# Patient Record
Sex: Male | Born: 1980 | Race: Black or African American | Hispanic: No | Marital: Married | Smoking: Never smoker
Health system: Southern US, Community
[De-identification: ages and names within clinical notes are randomized; demographics above are authoritative.]

## PROBLEM LIST (undated history)

## (undated) DIAGNOSIS — F4310 Post-traumatic stress disorder, unspecified: Secondary | ICD-10-CM

## (undated) DIAGNOSIS — I10 Essential (primary) hypertension: Secondary | ICD-10-CM

## (undated) DIAGNOSIS — G43909 Migraine, unspecified, not intractable, without status migrainosus: Secondary | ICD-10-CM

## (undated) HISTORY — DX: Essential (primary) hypertension: I10

## (undated) HISTORY — DX: Post-traumatic stress disorder, unspecified: F43.10

## (undated) HISTORY — DX: Migraine, unspecified, not intractable, without status migrainosus: G43.909

---

## 2005-05-03 ENCOUNTER — Emergency Department (HOSPITAL_COMMUNITY): Admission: EM | Admit: 2005-05-03 | Discharge: 2005-05-03 | Payer: Self-pay | Admitting: Emergency Medicine

## 2016-07-07 ENCOUNTER — Emergency Department (HOSPITAL_COMMUNITY): Payer: Self-pay

## 2016-07-07 ENCOUNTER — Emergency Department (HOSPITAL_COMMUNITY)
Admission: EM | Admit: 2016-07-07 | Discharge: 2016-07-07 | Disposition: A | Discharge status: Home / Self Care | Payer: Self-pay | Attending: Emergency Medicine | Admitting: Emergency Medicine

## 2016-07-07 DIAGNOSIS — W268XXA Contact with other sharp object(s), not elsewhere classified, initial encounter: Secondary | ICD-10-CM | POA: Insufficient documentation

## 2016-07-07 DIAGNOSIS — Y939 Activity, unspecified: Secondary | ICD-10-CM | POA: Insufficient documentation

## 2016-07-07 DIAGNOSIS — Y929 Unspecified place or not applicable: Secondary | ICD-10-CM | POA: Insufficient documentation

## 2016-07-07 DIAGNOSIS — Y999 Unspecified external cause status: Secondary | ICD-10-CM | POA: Insufficient documentation

## 2016-07-07 DIAGNOSIS — S61210A Laceration without foreign body of right index finger without damage to nail, initial encounter: Secondary | ICD-10-CM | POA: Insufficient documentation

## 2016-07-07 DIAGNOSIS — S61219A Laceration without foreign body of unspecified finger without damage to nail, initial encounter: Secondary | ICD-10-CM

## 2016-07-07 MED ORDER — LIDOCAINE HCL (PF) 1 % IJ SOLN
5.0000 mL | Freq: Once | INTRAMUSCULAR | Status: AC
  Administered 2016-07-07: 5 mL
  Filled 2016-07-07: qty 5

## 2016-07-07 NOTE — Discharge Instructions (Signed)
Read the information below.  You may return to the Emergency Department at any time for worsening condition or any new symptoms that concern you.  If you develop redness, swelling, pus draining from the wound, or fevers greater than 100.4, return to the ER immediately for a recheck.   °

## 2016-07-07 NOTE — ED Provider Notes (Signed)
[facility]-EMERGENCY DEPT Provider Note   CSN: [id number] Arrival date & time: 07/07/16  2876  First Provider Contact:  First MD Initiated Contact with Patient 07/07/16 [phone]         History   Chief Complaint No chief complaint on file.   HPI Julian Peters is a 35 y.o. male.  The history is provided by the patient.     Right handed patient presents with laceration over 2nd MCP that occurred last night while he was washing dishes.  Reports he reached into a glass to clean it and the glass broke, causing a laceration.  The wound "closed quickly" but continues to be sore, 6/10, worse with palpation, has decreased flexion at that joint due to swelling and pain.  Denies any other injury.  Denies numbness.  Last tetanus vaccination was 2015.   No past medical history on file.  There are no active problems to display for this patient.   No past surgical history on file.     Home Medications    Prior to Admission medications   Not on File    Family History No family history on file.  Social History Social History  Substance Use Topics  . Smoking status: Not on file  . Smokeless tobacco: Not on file  . Alcohol use Not on file     Allergies   Review of patient's allergies indicates no known allergies.   Review of Systems Review of Systems  Constitutional: Negative for chills and fever.  Musculoskeletal: Positive for arthralgias.  Skin: Positive for wound. Negative for color change and pallor.  Allergic/Immunologic: Negative for immunocompromised state.  Neurological: Negative for weakness and numbness.  Hematological: Does not bruise/bleed easily.  Psychiatric/Behavioral: Positive for self-injury (accidental ).     Physical Exam Updated Vital Signs There were no vitals taken for this visit.  Physical Exam  Constitutional: He appears well-developed and well-nourished.  HENT:  Head: Normocephalic and atraumatic.  Neck: Neck supple.  Pulmonary/Chest: Effort  normal.  Musculoskeletal:  Right hand with laceration overlying 2nd MCP, hemostatic.  Mild edema.  Slight decreased ROM of 2nd finger secondary to pain or swelling.  Distal sensation intact.  Moves all joints.  Capillary refill < 2 seconds.    Neurological: He is alert.  Nursing note and vitals reviewed.    ED Treatments / Results  Labs (all labs ordered are listed, but only abnormal results are displayed) Labs Reviewed - No data to display  EKG  EKG Interpretation None       Radiology No results found.  Procedures Procedures (including critical care time)  Medications Ordered in ED Medications  lidocaine (PF) (XYLOCAINE) 1 % injection 5 mL (not administered)     Initial Impression / Assessment and Plan / ED Course  I have reviewed the triage vital signs and the nursing notes.  Pertinent labs & imaging results that were available during my care of the patient were reviewed by me and considered in my medical decision making (see chart for details).  Clinical Course    LACERATION REPAIR Performed by: Trixie Dredge Authorized by: Trixie Dredge Consent: Verbal consent obtained. Risks and benefits: risks, benefits and alternatives were discussed Consent given by: patient Patient identity confirmed: provided demographic data Prepped and Draped in normal sterile fashion Wound explored  Laceration Location: dorsal 2nd MCP  Laceration Length: 1.5cm  No Foreign Bodies seen or palpated  Anesthesia: local infiltration  Local anesthetic: lidocaine 1% without epinephrine  Anesthetic total: 3 ml  Irrigation  method: syringe Amount of cleaning: standard  Skin closure: 5-0 vicryl  Number of sutures: 2  Technique: simple interrupted   Patient tolerance: Patient tolerated the procedure well with no immediate complications.   Afebrile, nontoxic patient with accidental laceration to dorsal right hand.  Wound thoroughly cleaned.  Xray negative.  Neurovascularly intact.   Absorbable sutures placed.  Discussed wound care, return precautions.   D/C home.  Discussed result, findings, treatment, and follow up  with patient.  Pt given return precautions.  Pt verbalizes understanding and agrees with plan.       Final Clinical Impressions(s) / ED Diagnoses   Final diagnoses:  Finger laceration, initial encounter    New Prescriptions There are no discharge medications for this patient.    Trixie Dredge, PA-C 07/07/16 [address] PA-C 07/07/16 1042    Maia Plan, MD 07/07/16 [phone]

## 2017-08-25 IMAGING — CR DG HAND COMPLETE 3+V*R*
3 series · 3 of 3 positions shown · non-contrast
Comparison: None.

CLINICAL DATA: 34-year-old male with laceration overlying the
second MCP joint

EXAM:
RIGHT HAND - COMPLETE 3+ VIEW

[hand pa]
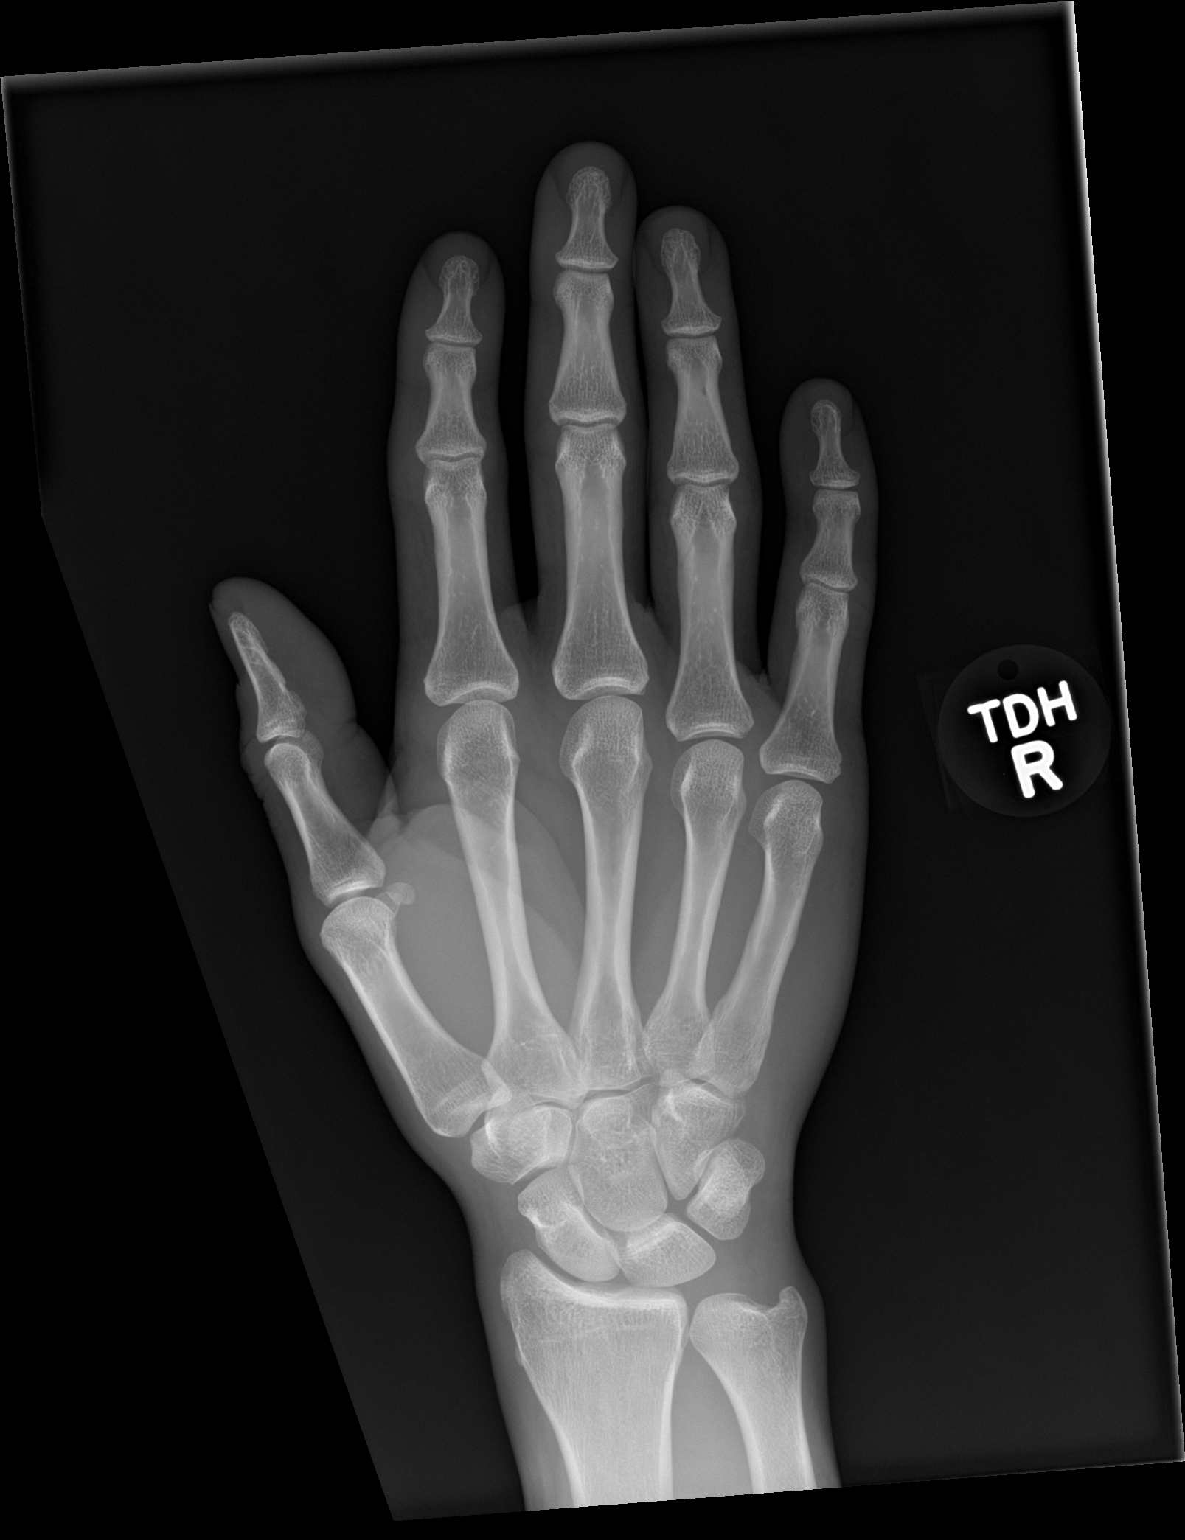

[hand obl]
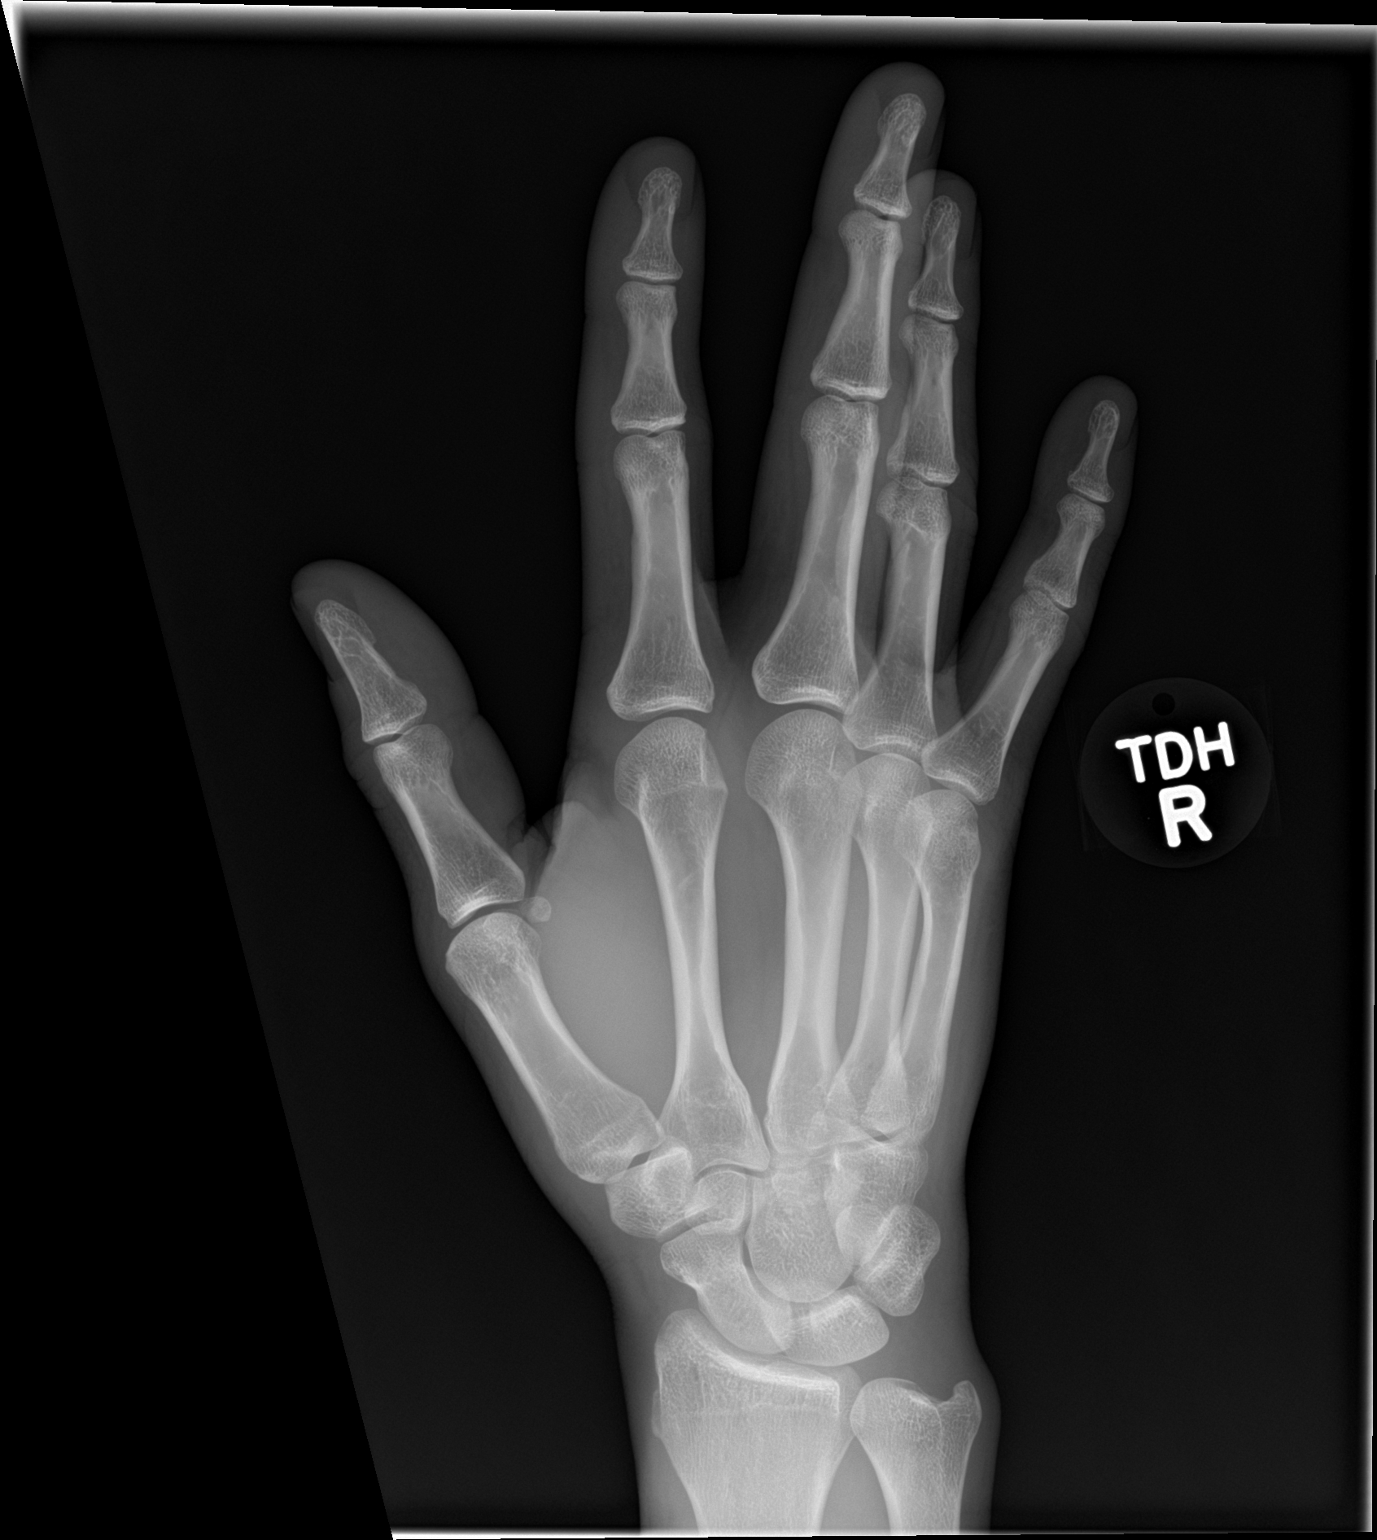

[hand lat]
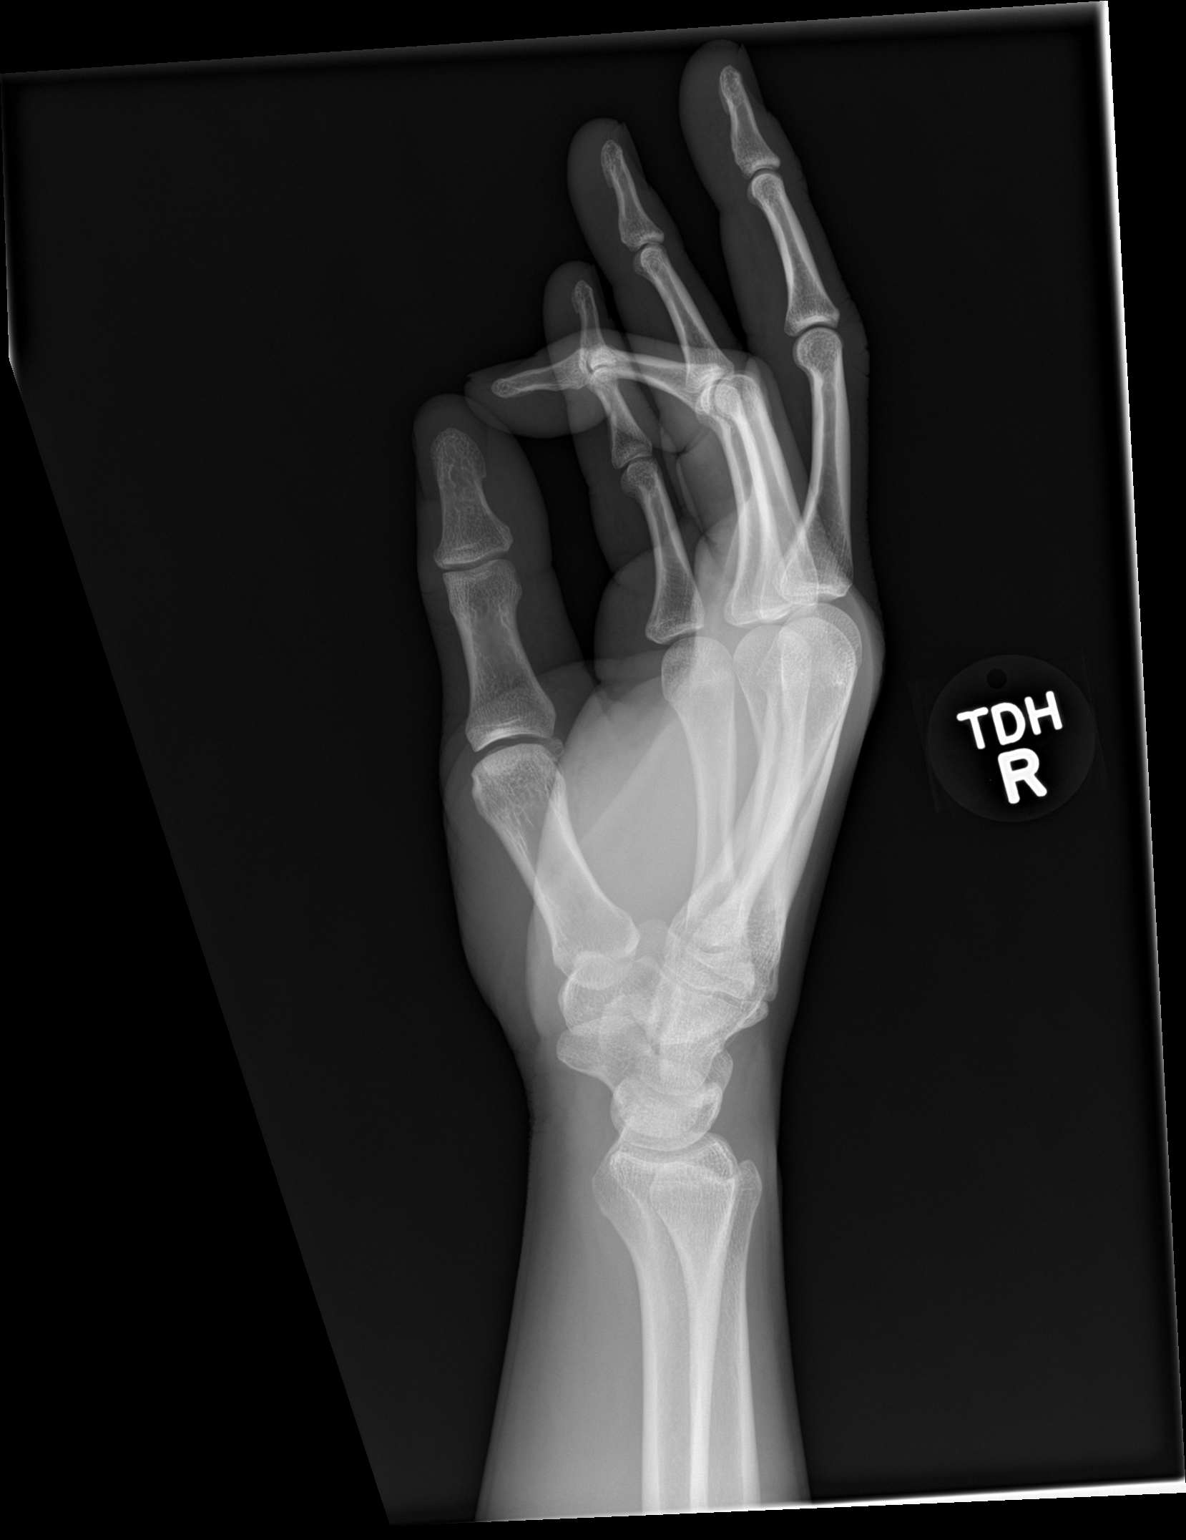

[3 of 3 positions shown; findings below may reference images not displayed]

FINDINGS: There is no evidence of fracture or dislocation. There is no
evidence of arthropathy or other focal bone abnormality. Soft
tissues are unremarkable.
IMPRESSION: Negative.

## 2020-10-27 ENCOUNTER — Ambulatory Visit: Admission: EM | Admit: 2020-10-27 | Discharge: 2020-10-27 | Discharge status: Against Medical Advice

## 2020-11-29 ENCOUNTER — Other Ambulatory Visit: Payer: Self-pay | Admitting: Family Medicine

## 2020-11-29 ENCOUNTER — Other Ambulatory Visit: Payer: Self-pay

## 2020-11-29 ENCOUNTER — Encounter: Payer: Self-pay | Admitting: Family Medicine

## 2020-11-29 ENCOUNTER — Telehealth: Payer: Self-pay

## 2020-11-29 ENCOUNTER — Ambulatory Visit (INDEPENDENT_AMBULATORY_CARE_PROVIDER_SITE_OTHER): Admitting: Family Medicine

## 2020-11-29 VITALS — BP 152/90 | HR 72 | Temp 97.5°F | Ht 67.5 in | Wt 168.5 lb

## 2020-11-29 DIAGNOSIS — D573 Sickle-cell trait: Secondary | ICD-10-CM

## 2020-11-29 DIAGNOSIS — G8929 Other chronic pain: Secondary | ICD-10-CM

## 2020-11-29 DIAGNOSIS — I10 Essential (primary) hypertension: Secondary | ICD-10-CM

## 2020-11-29 DIAGNOSIS — F4310 Post-traumatic stress disorder, unspecified: Secondary | ICD-10-CM

## 2020-11-29 DIAGNOSIS — R0789 Other chest pain: Secondary | ICD-10-CM

## 2020-11-29 DIAGNOSIS — M79671 Pain in right foot: Secondary | ICD-10-CM

## 2020-11-29 DIAGNOSIS — M545 Low back pain, unspecified: Secondary | ICD-10-CM | POA: Diagnosis not present

## 2020-11-29 DIAGNOSIS — M79672 Pain in left foot: Secondary | ICD-10-CM

## 2020-11-29 LAB — CBC
HCT: 45.3 % (ref 39.0–52.0)
Hemoglobin: 15.3 g/dL (ref 13.0–17.0)
MCHC: 33.8 g/dL (ref 30.0–36.0)
MCV: 84 fl (ref 78.0–100.0)
Platelets: 240 10*3/uL (ref 150.0–400.0)
RBC: 5.39 Mil/uL (ref 4.22–5.81)
RDW: 13.5 % (ref 11.5–15.5)
WBC: 4.6 10*3/uL (ref 4.0–10.5)

## 2020-11-29 LAB — COMPREHENSIVE METABOLIC PANEL
ALT: 14 U/L (ref 0–53)
AST: 20 U/L (ref 0–37)
Albumin: 4.6 g/dL (ref 3.5–5.2)
Alkaline Phosphatase: 48 U/L (ref 39–117)
BUN: 11 mg/dL (ref 6–23)
CO2: 33 mEq/L — ABNORMAL HIGH (ref 19–32)
Calcium: 9.7 mg/dL (ref 8.4–10.5)
Chloride: 98 mEq/L (ref 96–112)
Creatinine, Ser: 1.15 mg/dL (ref 0.40–1.50)
GFR: 80.26 mL/min (ref >60.00)
Glucose, Bld: 90 mg/dL (ref 70–99)
Potassium: 3.6 mEq/L (ref 3.5–5.1)
Sodium: 137 mEq/L (ref 135–145)
Total Bilirubin: 1.2 mg/dL (ref 0.2–1.2)
Total Protein: 8 g/dL (ref 6.0–8.3)

## 2020-11-29 MED ORDER — LISINOPRIL 10 MG PO TABS: 10.0000 mg | ORAL_TABLET | Freq: Every day | ORAL | 3 refills | Status: DC

## 2020-11-29 NOTE — Patient Instructions (Addendum)
Chest pain - EKG looked good - referral to cardiology  Foot pain - schedule visit with Dr. Patsy Lager  Back pain - physical therapy referral   Blood pressure - start lisinopril 10  - continue hydrochlorothiazide and amlodipine  - return in 3 months for blood pressure check  Try Melatonin 3-5 mg, take 1-2 hours before bed  Sleep hygiene checklist: 1. Avoid naps during the day 2. Avoid stimulants such as caffeine and nicotine. Avoid bedtime alcohol (it can speed onset of sleep but the body's metabolism can cause awakenings). At least 2 hours before bedtime 3. All forms of exercise help ensure sound sleep - limit vigorous exercise to morning or late afternoon 4. Avoid food too close to bedtime including chocolate (which contains caffeine) 5. Soak up natural light 6. Establish regular bedtime routine. 7. Associate bed with sleep - avoid TV, computer or phone, reading while in bed. 8. Ensure pleasant, relaxing sleep environment - quiet, dark, cool room.  Good Sleep Hygiene Habits -- Got to bed and wake up within an hour of the same time every day -- Avoid bright screens (from laptop, phone, TV) within at least 30 minutes before bed. The "blue light" supresses the sleep hormone melatonin and the content may stimulate as well -- Maintain a quiet and dark sleep environment (blackout curtains, turn on a fan or white noise to block out disruptive sounds) -- Practicing relaxing activites before bed (taking a shower, reading a book, journaling, meditation app) -- To quiet a busy mind -- consider journaling before bed (jotting down reminders, worry thoughts, as well as positive things like a gratitude list)   Begin a Mindfulness/Meditation practice -- this can take a little as 3 minutes -- You can find resources in books -- Or you can download apps like  ---- Headspace App (which currently has free content called "Weathering the Storm") ---- Calm (which has a few free options)  ---- Insignt  Timer ---- Stop, Breathe & Think  # With each of these Apps - you should decline the "start free trial" offer and as you search through the App should be able to access some of their free content. You can also chose to pay for the content if you find one that works well for you.   # Many of them also offer sleep specific content which may help with insomnia

## 2020-11-29 NOTE — Progress Notes (Signed)
Subjective:     Julian Peters is a 39 y.o. 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 - endorses occasion cp with heavy lifting or running - will get better with rest or stretching - no sob -   #PTSD - takes a long time to fall asleep - tried trazodone - no caffeine  Review of Systems   Social History   Tobacco Use  Smoking Status Never Smoker  Smokeless Tobacco Never Used        Objective:    BP Readings from Last 3 Encounters:  11/29/20 (!) 152/90  07/07/16 157/91   Wt Readings from Last 3 Encounters:  11/29/20 168 lb 8 oz (76.4 kg)    BP (!) 152/90   Pulse 72   Temp (!) 97.5 F (36.4 C) (Temporal)   Ht 5' 7.5" (1.715 m)   Wt 168 lb 8 oz (76.4 kg)   SpO2 97%   BMI 26.00 kg/m    Physical Exam Constitutional:      Appearance: Normal appearance. He is not ill-appearing or diaphoretic.  HENT:     Right Ear: External ear normal.     Left Ear: External ear normal.     Nose: Nose normal.  Eyes:     General: No scleral icterus.    Extraocular Movements: Extraocular movements intact.     Conjunctiva/sclera: Conjunctivae normal.  Cardiovascular:     Rate and Rhythm: Normal rate and regular rhythm.     Heart sounds: No murmur heard.   Pulmonary:     Effort: Pulmonary effort is normal. No respiratory distress.     Breath sounds: Normal breath sounds. No wheezing.  Musculoskeletal:     Cervical back: Neck supple.  Skin:    General: Skin is warm and dry.  Neurological:     Mental Status: He is alert. Mental status is at baseline.  Psychiatric:        Mood and Affect: Mood normal.      EKG: Sinus Bradycardia, no ST changes, no T-wave abnormalities     Assessment & Plan:   Problem List Items Addressed This Visit      Cardiovascular and Mediastinum   Essential hypertension    BP elevated. Pt endorses  adherence to HCTZ 25 mg and amlodipine 10 mg. Initial bp on diagnosis with SBP 180 so he will likely benefit from 3rd agent. Start lisinopril 10 mg. Labs today and 2-3 weeks to check kidney function. Return for BP check in 3 months. Encouraged returning to regular exercise with PT referral. Cardiology given CP with activity.       Relevant Medications   hydrochlorothiazide (HYDRODIURIL) 25 MG tablet   amLODipine (NORVASC) 10 MG tablet   lisinopril (ZESTRIL) 10 MG tablet   Other Relevant Orders   Comprehensive metabolic panel   Lipid panel   CBC     Other   PTSD (post-traumatic stress disorder)    Advised trial of melatonin. Could consider prazosin however, denies consistent nightmares. Follow-up prn.       Chronic low back pain    Encouraged trial of PT as pain is limiting his exercise habits. Referral placed.       Relevant Orders   Ambulatory referral to Physical Therapy   Sickle cell trait (HCC)   Relevant Orders   CBC   Other chest  pain - Primary    EKG reassuring though pt with HTN and CP with high intensity activity which resolves with rest. Given that he serves in the [employer] lower suspicion and denies family history but will refer to cardiology to consider additional work-up.       Relevant Orders   Ambulatory referral to Cardiology   EKG 12-Lead (Completed)   Pain in both feet    Unable to do full exam today. ?plantar fascitis vs other arch related pain. Advised seeing Dr. Patsy Lager for evaluation and treatment.           Return in about 3 months (around 02/27/2021) for blood pressure.  Lynnda Child, MD  This visit occurred during the SARS-CoV-2 public health emergency.  Safety protocols were in place, including screening questions prior to the visit, additional usage of staff PPE, and extensive cleaning of exam room while observing appropriate contact time as indicated for disinfecting solutions.

## 2020-11-29 NOTE — Assessment & Plan Note (Signed)
EKG reassuring though pt with HTN and CP with high intensity activity which resolves with rest. Given that he serves in the [employer] lower suspicion and denies family history but will refer to cardiology to consider additional work-up.

## 2020-11-29 NOTE — Telephone Encounter (Signed)
[facility] Night - Client Nonclinical Telephone Record AccessNurse Client [facility] Night - Client Client Site [facility] - Night Contact Type Call Who Is Calling Patient / Member / Family / Caregiver Caller Name Duanne Duchesne Caller Phone Number [phone] Patient Name Julian Peters Patient DOB 1981/06/14 Call Type Message Only Information Provided Reason for Call Request for General Office Information Initial Comment Pt is needing to know the time of their appt for tomorrow. Disp. Time Disposition Final User 11/28/2020 5:41:05 PM General Information Provided Yes Wisdom, Melynda Call Closed By: Rodena Piety Transaction Date/Time: 11/28/2020 5:39:34 PM (ET)

## 2020-11-29 NOTE — Assessment & Plan Note (Signed)
Unable to do full exam today. ?plantar fascitis vs other arch related pain. Advised seeing Dr. Patsy Lager for evaluation and treatment.

## 2020-11-29 NOTE — Assessment & Plan Note (Signed)
Encouraged trial of PT as pain is limiting his exercise habits. Referral placed.

## 2020-11-29 NOTE — Assessment & Plan Note (Signed)
Advised trial of melatonin. Could consider prazosin however, denies consistent nightmares. Follow-up prn.

## 2020-11-29 NOTE — Telephone Encounter (Signed)
I spoke with pt and pt has already called [facility] this morning and is aware pt has appt today at 9:20. Nothing further needed.

## 2020-11-29 NOTE — Assessment & Plan Note (Signed)
BP elevated. Pt endorses adherence to HCTZ 25 mg and amlodipine 10 mg. Initial bp on diagnosis with SBP 180 so he will likely benefit from 3rd agent. Start lisinopril 10 mg. Labs today and 2-3 weeks to check kidney function. Return for BP check in 3 months. Encouraged returning to regular exercise with PT referral. Cardiology given CP with activity.

## 2020-12-04 ENCOUNTER — Telehealth: Payer: Self-pay | Admitting: Family Medicine

## 2020-12-04 NOTE — Telephone Encounter (Signed)
Dr Selena Batten let me know if this is medically necessary before I call and try to get this approved. And any other information you want me to know about. Thank you

## 2020-12-04 NOTE — Telephone Encounter (Signed)
Hayley called in and stated that Julian Peters is active [employer] and the referral /authorization would have to go through [employer] stating that is medically necessary

## 2020-12-04 NOTE — Telephone Encounter (Signed)
Submitted pre approval to get access to the website on behalf of the provider to submit authorization for this. Awaiting response before proceeding.

## 2020-12-04 NOTE — Telephone Encounter (Signed)
Pt with CP with physical activity and has a hx of HTN which is not well controlled. Feel he would benefit from further cardiology work-up.   Also with long standing chronic back pain and would benefit from physical therapy evaluation and treatment to help with improvement in symptoms and return to routine activity.   Both are medically necessary

## 2020-12-12 NOTE — Telephone Encounter (Signed)
Could not get through to the right department on the phone, sent follow up email in regards to this. Awaiting reply

## 2020-12-13 ENCOUNTER — Encounter: Payer: Self-pay | Admitting: Family Medicine

## 2020-12-13 NOTE — Telephone Encounter (Signed)
Working on this authorization still. Sent email to [employer] due to the issues with website submission of referral. Hayley at [employer] is aware of this and I will update her when I have more information

## 2020-12-13 NOTE — Telephone Encounter (Signed)
Received response from [facility] on this patient. I advised Hayley with [employer] of this and send them this information. For our records this is what the response was (this is in the provider portal also)  "12/13/2020 Ticket #[id number]   From:Wayneesha S. Sent:12/13/2020 Category:Referral/Authorizations  Thank you for using [employer] Secure Messaging for [employer].   You may view the patient's eligibility and TRICARE plan using the 'TRICARE PATIENT PROFILE' tab. 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   The patient does not require a referral authorization or prior authorization from TRICARE; for the patient to see a specialist or for outpatient setting for physical therapy.    If this patient would like to become TRICARE Prime, then they must speak to a billing and enrollment specialist at [phone]. Billing and enrollment is the only one able to enroll the patient into TRICARE Prime.    I hope this information has been helpful. If you have any additional questions or if you need further assistance, please contact [employer] at [phone], chat, or through secure messaging.  Thank you for allowing me to serve you today.  Gregor Hams S.  [facility]

## 2020-12-14 ENCOUNTER — Other Ambulatory Visit: Payer: Self-pay

## 2020-12-14 ENCOUNTER — Encounter: Payer: Self-pay | Admitting: Internal Medicine

## 2020-12-14 ENCOUNTER — Ambulatory Visit (INDEPENDENT_AMBULATORY_CARE_PROVIDER_SITE_OTHER): Admitting: Internal Medicine

## 2020-12-14 VITALS — BP 128/80 | HR 62 | Ht 68.0 in | Wt 165.0 lb

## 2020-12-14 DIAGNOSIS — R079 Chest pain, unspecified: Secondary | ICD-10-CM | POA: Diagnosis not present

## 2020-12-14 DIAGNOSIS — I10 Essential (primary) hypertension: Secondary | ICD-10-CM

## 2020-12-14 NOTE — Progress Notes (Signed)
New Outpatient Visit Date: 12/14/2020  Referring Provider: Lynnda Child, MD [address] [place],  [place] [address]  Chief Complaint: Chest pain  HPI:  Julian Peters is a 39 y.o. male who is being seen today for the evaluation of chest pain at the request of Dr. Selena Batten. He has a history of hypertension, sickle cell trait, PTSD, and chronic low back pain.  Julian Peters reports episodic chest pain/tightness that has been present for years.  Sometimes it may happen on a weekly basis, though he can go a few months at a time without it happening.  It is in the center of his chest with a maximum intensity of 7-8/10.  It does not radiate and there are no associated symptoms.  At times, he will stretch his chest wall and feel a pop in the center of his chest with improvement in the pain.  The discomfort is sometimes happens when he exerts himself or carries a heavy object.  He believes that he may have had some cardiac testing in the past through the [employer] though he does not believe he ever underwent ischemia testing.  --------------------------------------------------------------------------------------------------  Cardiovascular History & Procedures: Cardiovascular Problems:  Chest pain  Risk Factors:  Hypertension and male gender  Cath/PCI:  None  CV Surgery:  None  EP Procedures and Devices:  None  Non-Invasive Evaluation(s):  None  Recent CV Pertinent Labs: Lab Results  Component Value Date   K 3.6 11/29/2020   BUN 11 11/29/2020   CREATININE 1.15 11/29/2020    --------------------------------------------------------------------------------------------------  Past Medical History:  Diagnosis Date  . Hypertension   . PTSD (post-traumatic stress disorder)     History reviewed. No pertinent surgical history.  Current Meds  Medication Sig  . amLODipine (NORVASC) 10 MG tablet Take 5 mg by mouth daily.  . hydrochlorothiazide (HYDRODIURIL) 25 MG tablet Take 25 mg  by mouth daily.  Marland Kitchen lisinopril (ZESTRIL) 10 MG tablet Take 1 tablet (10 mg total) by mouth daily.    Allergies: Patient has no known allergies.  Social History   Tobacco Use  . Smoking status: Never Smoker  . Smokeless tobacco: Never Used  Vaping Use  . Vaping Use: Never used  Substance Use Topics  . Alcohol use: Never  . Drug use: Never    Family History  Problem Relation Age of Onset  . Sickle cell anemia Father   . Diabetes Maternal Grandmother   . Heart disease Neg Hx     Review of Systems: A 12-system review of systems was performed and was negative except as noted in the HPI.  --------------------------------------------------------------------------------------------------  Physical Exam: BP 128/80 (BP Location: Right Arm, Patient Position: Sitting, Cuff Size: Normal)   Pulse 62   Ht 5\' 8"  (1.727 m)   Wt 165 lb (74.8 kg)   SpO2 97%   BMI 25.09 kg/m   General: NAD. HEENT: No conjunctival pallor or scleral icterus. Facemask in place. Neck: Supple without lymphadenopathy, thyromegaly, JVD, or HJR. No carotid bruit. Lungs: Normal work of breathing. Clear to auscultation bilaterally without wheezes or crackles. Heart: Regular rate and rhythm without murmurs, rubs, or gallops. Non-displaced PMI. Abd: Bowel sounds present. Soft, NT/ND without hepatosplenomegaly Ext: No lower extremity edema. Radial, PT, and DP pulses are 2+ bilaterally Skin: Warm and dry without rash. Neuro: CNIII-XII intact. Strength and fine-touch sensation intact in upper and lower extremities bilaterally. Psych: Normal mood and affect.  EKG: Normal sinus rhythm without abnormality.  No significant change from prior  tracing on 11/29/2020.  Lab Results  Component Value Date   WBC 4.6 11/29/2020   HGB 15.3 11/29/2020   HCT 45.3 11/29/2020   MCV 84.0 11/29/2020   PLT 240.0 11/29/2020    Lab Results  Component Value Date   NA 137 11/29/2020   K 3.6 11/29/2020   CL 98 11/29/2020   CO2 33  (H) 11/29/2020   BUN 11 11/29/2020   CREATININE 1.15 11/29/2020   GLUCOSE 90 11/29/2020   ALT 14 11/29/2020    No results found for: CHOL, HDL, LDLCALC, LDLDIRECT, TRIG, CHOLHDL   --------------------------------------------------------------------------------------------------  ASSESSMENT AND PLAN: Chest pain: [name] has episodic chest discomfort that has both typical and atypical features, present for years.  Cardiac risk factors include hypertension and male gender.  EKG and physical exam today are unrevealing.  Though my overall suspicion for ischemic heart disease is low, we have agreed to perform an exercise tolerance test.  If this is low risk, I would favor deferring additional testing and consider empiric treatment for musculoskeletal chest pain.  Lipid panel should be considered for risk stratification the next time Julian Peters has fasting labs drawn through his PCP.  Shared Decision Making/Informed Consent{ The risks [chest pain, shortness of breath, cardiac arrhythmias, dizziness, blood pressure fluctuations, myocardial infarction, stroke/transient ischemic attack, and life-threatening complications (estimated to be 1 in 10,000)], benefits (risk stratification, diagnosing coronary artery disease, treatment guidance) and alternatives of an exercise tolerance test were discussed in detail with Julian Peters and he agrees to proceed.  Hypertension: Blood pressure upper normal today.  We will continue his current regimen of amlodipine, HCTZ, and lisinopril, with ongoing management per Dr. Selena Batten.  Follow-up: Return to clinic as needed based on results of stress test and symptoms.  Yvonne Kendall, MD 12/14/2020 2:37 PM

## 2020-12-14 NOTE — Patient Instructions (Signed)
Medication Instructions:   Your physician recommends that you continue on your current medications as directed. Please refer to the Current Medication list given to you today.  *If you need a refill on your cardiac medications before your next appointment, please call your pharmacy*   Lab Work: None ordered    Testing/Procedures:  Your provider has recommended that we order a Treadmill Stress Test:  Date__________________  Time__________________  - you may eat a light breakfast/ lunch prior to your procedure - no caffeine for 24 hours prior to your test (coffee, tea, soft drinks, or chocolate)  - no smoking/ vaping for 4 hours prior to your test - you may take your regular medications the day of your test. - bring any inhalers with you to your test - wear comfortable clothing & tennis/ non-skid shoes to walk on the treadmill  COVID PRE- TEST: You will need a COVID TEST prior to the procedure:  LOCATION: [facility]Thru Testing site.  DATE/TIME:                  (8:00 am- 1 pm)    Follow-Up: At [facility], you and your health needs are our priority.  As part of our continuing mission to provide you with exceptional heart care, we have created designated Provider Care Teams.  These Care Teams include your primary Cardiologist (physician) and Advanced Practice Providers (APPs -  Physician Assistants and Nurse Practitioners) who all work together to provide you with the care you need, when you need it.  We recommend signing up for the patient portal called "MyChart".  Sign up information is provided on this After Visit Summary.  MyChart is used to connect with patients for Virtual Visits (Telemedicine).  Patients are able to view lab/test results, encounter notes, upcoming appointments, etc.  Non-urgent messages can be sent to your provider as well.   To learn more about what you can do with MyChart, go to [email or web address].    Your next  appointment:    Follow up AS NEEDED depending on your chest pain and results of stress test  The format for your next appointment:   In Person  Provider:   You may see Dr. Okey Dupre or one of the following Advanced Practice Providers on your designated Care Team:    Nicolasa Ducking, NP  Eula Listen, PA-C  Marisue Ivan, PA-C  [place], [place]  Gillian Shields, NP

## 2020-12-18 ENCOUNTER — Other Ambulatory Visit
Admission: RE | Admit: 2020-12-18 | Discharge: 2020-12-18 | Disposition: A | Discharge status: Home / Self Care | Source: Ambulatory Visit | Attending: Internal Medicine | Admitting: Internal Medicine

## 2020-12-18 DIAGNOSIS — Z20822 Contact with and (suspected) exposure to covid-19: Secondary | ICD-10-CM | POA: Diagnosis not present

## 2020-12-18 DIAGNOSIS — Z01818 Encounter for other preprocedural examination: Secondary | ICD-10-CM | POA: Insufficient documentation

## 2020-12-19 ENCOUNTER — Ambulatory Visit (INDEPENDENT_AMBULATORY_CARE_PROVIDER_SITE_OTHER)

## 2020-12-19 ENCOUNTER — Other Ambulatory Visit: Payer: Self-pay

## 2020-12-19 DIAGNOSIS — R079 Chest pain, unspecified: Secondary | ICD-10-CM | POA: Diagnosis not present

## 2020-12-19 LAB — EXERCISE TOLERANCE TEST
Estimated workload: 17.2 METS
Exercise duration (min): 14 min
Exercise duration (sec): 12 s
MPHR: 181 {beats}/min
Peak HR: 181 {beats}/min
Percent HR: 100 %
RPE: 17
Rest HR: 72 {beats}/min

## 2020-12-19 LAB — SARS CORONAVIRUS 2 (TAT 6-24 HRS): SARS Coronavirus 2: NEGATIVE

## 2020-12-20 ENCOUNTER — Telehealth: Payer: Self-pay

## 2020-12-20 NOTE — Telephone Encounter (Signed)
Late entry for 01/19/21 ETT.  Patient hypertensive during recovery. His systolic BP returned to his initial reading, his diastolic was 115 after >10 mins of recovery time.  Discussed with our office DOD Dr. Mariah Milling.  Dr. Mariah Milling instructions given to the pt: Pt is off work today. He does have a BP machine at home. He is to go home, relax for a bit and then recheck his BP. If he his BP remains elevated > 140/90. Pt is to contact the office this afternoon to provide and update. Further instructions to be given at that time.  Adv the patient to continue to monitor his BP regularly. He is to contact the office to update Dr. Okey Dupre  if his BP is consistently elevated.  Patient verbalized understanding and is agreeable with the plan.

## 2021-02-27 ENCOUNTER — Ambulatory Visit (INDEPENDENT_AMBULATORY_CARE_PROVIDER_SITE_OTHER): Admitting: Family Medicine

## 2021-02-27 ENCOUNTER — Encounter: Payer: Self-pay | Admitting: Family Medicine

## 2021-02-27 ENCOUNTER — Other Ambulatory Visit: Payer: Self-pay

## 2021-02-27 VITALS — BP 118/80 | HR 65 | Temp 97.8°F | Ht 68.0 in | Wt 165.5 lb

## 2021-02-27 DIAGNOSIS — R0789 Other chest pain: Secondary | ICD-10-CM | POA: Diagnosis not present

## 2021-02-27 DIAGNOSIS — I10 Essential (primary) hypertension: Secondary | ICD-10-CM | POA: Diagnosis not present

## 2021-02-27 LAB — BASIC METABOLIC PANEL
BUN: 13 mg/dL (ref 6–23)
CO2: 32 mEq/L (ref 19–32)
Calcium: 9.4 mg/dL (ref 8.4–10.5)
Chloride: 98 mEq/L (ref 96–112)
Creatinine, Ser: 1.15 mg/dL (ref 0.40–1.50)
GFR: 80.12 mL/min (ref >60.00)
Glucose, Bld: 93 mg/dL (ref 70–99)
Potassium: 4 mEq/L (ref 3.5–5.1)
Sodium: 137 mEq/L (ref 135–145)

## 2021-02-27 NOTE — Assessment & Plan Note (Signed)
Controlled. Labs today. Cont lisinopril 10 mg, HCTZ 25 mg, and amlodpine 10 mg. Discussed could consider combination pill in the future.

## 2021-02-27 NOTE — Patient Instructions (Addendum)
Call when running out or you can ask your pharmacist about how much the combination pill Zestoretic (lisinopril-HCTZ combination)   Bring a copy of immunizations   And your Hep C / HIV results

## 2021-02-27 NOTE — Assessment & Plan Note (Signed)
Normal ETT. Suspect MSK. Advised stretching and strengthening of chest muscles

## 2021-02-27 NOTE — Progress Notes (Signed)
   Subjective:     Julian Peters is a 40 y.o. male presenting for Follow-up (BP )     HPI  #HTN - taking medication - feels his bp is improved - is getting cp when he lifts something heavy - no sob - occasional headaches - no side effects with the medication  #back pain - improving with PT   Review of Systems  11/29/2020: Clinic - HTN - HCTZ, amlodipine, start lisinopril. CP - seeing cards and getting exercise tolerance. ETT: DBP >115 10 minutes into recover  Social History   Tobacco Use  Smoking Status Never Smoker  Smokeless Tobacco Never Used        Objective:    BP Readings from Last 3 Encounters:  02/27/21 118/80  12/14/20 128/80  11/29/20 (!) 152/90   Wt Readings from Last 3 Encounters:  02/27/21 165 lb 8 oz (75.1 kg)  12/14/20 165 lb (74.8 kg)  11/29/20 168 lb 8 oz (76.4 kg)    BP 118/80   Pulse 65   Temp 97.8 F (36.6 C) (Temporal)   Ht 5\' 8"  (1.727 m)   Wt 165 lb 8 oz (75.1 kg)   SpO2 99%   BMI 25.16 kg/m    Physical Exam Constitutional:      Appearance: Normal appearance. He is not ill-appearing or diaphoretic.  HENT:     Right Ear: External ear normal.     Left Ear: External ear normal.  Eyes:     General: No scleral icterus.    Extraocular Movements: Extraocular movements intact.     Conjunctiva/sclera: Conjunctivae normal.  Cardiovascular:     Rate and Rhythm: Normal rate.  Pulmonary:     Effort: Pulmonary effort is normal.  Chest:     Chest wall: No tenderness or crepitus.  Musculoskeletal:     Cervical back: Neck supple.  Skin:    General: Skin is warm and dry.  Neurological:     Mental Status: He is alert. Mental status is at baseline.  Psychiatric:        Mood and Affect: Mood normal.     ETT: normal apart from elevated bp      Assessment & Plan:   Problem List Items Addressed This Visit      Cardiovascular and Mediastinum   Essential hypertension - Primary    Controlled. Labs today. Cont lisinopril 10  mg, HCTZ 25 mg, and amlodpine 10 mg. Discussed could consider combination pill in the future.       Relevant Orders   Basic metabolic panel     Other   Other chest pain    Normal ETT. Suspect MSK. Advised stretching and strengthening of chest muscles          Return in about 1 year (around 02/27/2022).  03/01/2022, MD  This visit occurred during the SARS-CoV-2 public health emergency.  Safety protocols were in place, including screening questions prior to the visit, additional usage of staff PPE, and extensive cleaning of exam room while observing appropriate contact time as indicated for disinfecting solutions.

## 2021-05-03 ENCOUNTER — Telehealth: Payer: Self-pay | Admitting: Family Medicine

## 2021-05-03 DIAGNOSIS — I10 Essential (primary) hypertension: Secondary | ICD-10-CM

## 2021-05-03 MED ORDER — LISINOPRIL 10 MG PO TABS: 10.0000 mg | ORAL_TABLET | Freq: Every day | ORAL | 1 refills | Status: DC

## 2021-05-03 NOTE — Telephone Encounter (Signed)
  LAST APPOINTMENT DATE: 02/27/2021   NEXT APPOINTMENT DATE:@Visit  date not found  MEDICATION: lisinopril  PHARMACY: [facility]- [address]  Let patient know to contact pharmacy at the end of the day to make sure medication is ready.  Please notify patient to allow 48-72 hours to process  Encourage patient to contact the pharmacy for refills or they can request refills through [facility]  CLINICAL FILLS OUT ALL BELOW:   LAST REFILL:  QTY:  REFILL DATE:    OTHER COMMENTS:    Okay for refill?  Please advise

## 2021-05-03 NOTE — Addendum Note (Signed)
Addended by: Erby Pian on: 05/03/2021 11:33 AM   Modules accepted: Orders

## 2021-08-08 ENCOUNTER — Other Ambulatory Visit: Payer: Self-pay

## 2021-08-08 ENCOUNTER — Encounter: Payer: Self-pay | Admitting: Family Medicine

## 2021-08-08 ENCOUNTER — Ambulatory Visit (INDEPENDENT_AMBULATORY_CARE_PROVIDER_SITE_OTHER): Admitting: Family Medicine

## 2021-08-08 VITALS — BP 110/80 | HR 60 | Temp 97.9°F | Ht 68.0 in | Wt 167.2 lb

## 2021-08-08 DIAGNOSIS — M7661 Achilles tendinitis, right leg: Secondary | ICD-10-CM

## 2021-08-08 DIAGNOSIS — M7662 Achilles tendinitis, left leg: Secondary | ICD-10-CM | POA: Diagnosis not present

## 2021-08-08 DIAGNOSIS — Z23 Encounter for immunization: Secondary | ICD-10-CM | POA: Diagnosis not present

## 2021-08-08 NOTE — Progress Notes (Signed)
   Subjective:     Julian Peters is a 40 y.o. male presenting for Ankle Pain (Both ankles x months )     Ankle Pain  Associated symptoms include numbness.   #ankle pain - both sides - in the back - near achilles tendon - after periods of rest/driving and then pain with standing - pain with walking for 10-15 minutes - will also get pain after a long run - initially just the right and sometimes the left - R>L - hx of achilles tendon tear in 2005 - playing basketball on the left side - has been doing stretches and general exercises    Review of Systems  Constitutional:  Negative for chills and fever.  Neurological:  Positive for numbness. Negative for weakness.    Social History   Tobacco Use  Smoking Status Never  Smokeless Tobacco Never        Objective:    BP Readings from Last 3 Encounters:  08/08/21 110/80  02/27/21 118/80  12/14/20 128/80   Wt Readings from Last 3 Encounters:  08/08/21 167 lb 4 oz (75.9 kg)  02/27/21 165 lb 8 oz (75.1 kg)  12/14/20 165 lb (74.8 kg)    BP 110/80   Pulse 60   Temp 97.9 F (36.6 C) (Temporal)   Ht 5\' 8"  (1.727 m)   Wt 167 lb 4 oz (75.9 kg)   SpO2 97%   BMI 25.43 kg/m    Physical Exam Constitutional:      Appearance: Normal appearance. He is not ill-appearing or diaphoretic.  HENT:     Right Ear: External ear normal.     Left Ear: External ear normal.  Eyes:     General: No scleral icterus.    Extraocular Movements: Extraocular movements intact.     Conjunctiva/sclera: Conjunctivae normal.  Cardiovascular:     Rate and Rhythm: Normal rate.  Pulmonary:     Effort: Pulmonary effort is normal.  Musculoskeletal:     Cervical back: Neck supple.     Comments: Foot Inspection: no swelling erythema, b/l ROM: normal b/l Strength: normal b/l Palpation: Right ttp along the achilles tendon, left ttp along the arch of the foot, otherwise no bony ttp No laxity   Skin:    General: Skin is warm and dry.   Neurological:     Mental Status: He is alert. Mental status is at baseline.  Psychiatric:        Mood and Affect: Mood normal.          Assessment & Plan:   Problem List Items Addressed This Visit       Musculoskeletal and Integument   Achilles tendinitis of both lower extremities - Primary    Advised NSAIDs and home exercise/stretching routine. PT if not improving.       Other Visit Diagnoses     Need for Tdap vaccination       Relevant Orders   Tdap vaccine greater than or equal to 7yo IM (Completed)        Return if symptoms worsen or fail to improve.  , MD  This visit occurred during the SARS-CoV-2 public health emergency.  Safety protocols were in place, including screening questions prior to the visit, additional usage of staff PPE, and extensive cleaning of exam room while observing appropriate contact time as indicated for disinfecting solutions.

## 2021-08-08 NOTE — Assessment & Plan Note (Signed)
Advised NSAIDs and home exercise/stretching routine. PT if not improving.

## 2021-08-08 NOTE — Patient Instructions (Signed)
Do home exercise  If not improvement in 2-3 weeks call and can plan for physical therapy referral

## 2021-12-13 ENCOUNTER — Other Ambulatory Visit: Payer: Self-pay | Admitting: Family Medicine

## 2021-12-13 DIAGNOSIS — I10 Essential (primary) hypertension: Secondary | ICD-10-CM

## 2023-12-09 NOTE — Progress Notes (Addendum)
New Patient Office Visit  Subjective    Patient ID: Julian Peters, male    DOB: [birth year]  Age: 42 y.o. MRN: [id number]  CC:  Chief Complaint  Patient presents with  . Establish Care    TOC from Dr. Selena Batten    HPI Julian Peters is a 42 y.o. male presents to establish care from Dr. Gweneth Dimitri.   [facility] [place] and follows with them every 6 months.   Previous PCP was Gweneth Dimitri, MD. Last physical and labs: September he had labs at the [facility]  HTN: diagnosed several years ago. Currently Amlodipine 10 mg, HTCZ 25 mg and Lisinopril 10 mg He does check his blood pressure at home. He does have headaches but denies chest pain, shortness of breath or difficulty breathing. Denies any leg swelling.   Headache: Intermittent and chronic. Sometimes 2-3 times a week or 3-4 times a month. Headache is located either frontal lobes bilaterally and at times it is in the occipital lobes bilaterally. He does have photophobia. Occasional nausea with the headache but rarely. He does not have a history migraine. 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 He would go to the bathroom and sit with the light off, which gives him some relief. He has not tried any medications for his headache.   PTSD: diagnosed in 2020. He is followed by a psychologist and has an appointment 1-2 times per year.   Sickle Cell trait: Patient denies He has sickle cell anemia.   Cough: he reports having intermittent cough since 2022. He says that his cough doesn't come with any other symptoms and denies any wheezing and shortness of breath.    Outpatient Encounter Medications as of 12/12/2023  Medication Sig  . amLODipine (NORVASC) 10 MG tablet Take 5 mg by mouth daily.  . cyclobenzaprine (FLEXERIL) 5 MG tablet Take 1 tablet (5 mg total) by mouth at bedtime as needed for muscle spasms (headache).  . hydrochlorothiazide (HYDRODIURIL) 25 MG tablet Take 25 mg by mouth daily.  Marland Kitchen  lisinopril (ZESTRIL) 10 MG tablet Take 1 tablet (10 mg total) by mouth daily. Pt needs appt with PCP for further refills.   No facility-administered encounter medications on file as of 12/12/2023.    Past Medical History:  Diagnosis Date  . Hypertension   . Migraine   . PTSD (post-traumatic stress disorder)     History reviewed. No pertinent surgical history.  Family History  Problem Relation Age of Onset  . Sickle cell anemia Father   . Diabetes Maternal Grandmother   . Heart disease Neg Hx     Social History   Socioeconomic History  . Marital status: Married    Spouse name: Julian Peters  . Number of children: 2  . Years of education: some college  . Highest education level: Not on file  Occupational History  . Not on file  Tobacco Use  . Smoking status: Never  . Smokeless tobacco: Never  Vaping Use  . Vaping status: Never Used  Substance and Sexual Activity  . Alcohol use: Never  . Drug use: Never  . Sexual activity: Yes    Birth control/protection: I.U.D.  Other Topics Concern  . 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 [employer] -  one weekend a month      Family: 2 kids - Lane Hacker (2018) and [place] (2008), and 2 step children      Enjoys: spend time with family, video games      Exercise: not currently - due to foot/back pain - used to do daily   Diet: light meals, skips breakfast      Safety   Seat belts: Yes    Guns: No   Safe in relationships: Yes    Social Drivers of Corporate investment banker Strain: Not on file  Food Insecurity: Not on file  Transportation Needs: Not on file  Physical Activity: Not on file  Stress: Not on file  Social Connections: Not on file  Intimate Partner Violence: Not on file    Review of Systems  Constitutional:  Negative for chills, fever and weight loss.  Eyes:   Negative for blurred vision.  Respiratory:  Positive for cough. Negative for shortness of breath.   Cardiovascular:  Negative for chest pain.  Neurological:  Positive for dizziness and headaches. Negative for tingling, tremors, speech change, seizures, loss of consciousness and weakness.  Psychiatric/Behavioral:  Negative for depression. The patient has insomnia. The patient is not nervous/anxious.         Objective    BP 100/62 (BP Location: Left Arm, Patient Position: Sitting, Cuff Size: Large)   Pulse 62   Temp (!) 97.5 F (36.4 C) (Temporal)   Ht 5' 7.5" (1.715 m)   Wt 168 lb 6 oz (76.4 kg)   SpO2 98%   BMI 25.98 kg/m   Physical Exam Vitals and nursing note reviewed.  Constitutional:      Appearance: Normal appearance.  Eyes:     Extraocular Movements: Extraocular movements intact.     Conjunctiva/sclera: Conjunctivae normal.     Pupils: Pupils are equal, round, and reactive to light.  Cardiovascular:     Rate and Rhythm: Normal rate and regular rhythm.     Pulses: Normal pulses.     Heart sounds: Normal heart sounds.  Pulmonary:     Effort: Pulmonary effort is normal.     Breath sounds: Normal breath sounds.  Neurological:     General: No focal deficit present.     Mental Status: He is alert and oriented to person, place, and time.     Cranial Nerves: Cranial nerves 2-12 are intact.     Motor: No weakness or tremor.     Gait: Gait is intact.     Deep Tendon Reflexes:     Reflex Scores:      Patellar reflexes are 2+ on the right side and 2+ on the left side. Psychiatric:        Mood and Affect: Mood normal.        Behavior: Behavior normal.        Thought Content: Thought content normal.        Judgment: Judgment normal.        Assessment & Plan:  Essential hypertension Assessment & Plan: BP at goal today.  Continue Amolodipine 10 mg tablet, hydrochlorothiazide 25 mg and lisinopril 10 mg.   BMP pending.  Orders: -     CBC with  Differential/Platelet -     Basic metabolic panel  Chronic cough Assessment & Plan: Unclear etiology.  Differentials include secondary to the use of ACE inhibitor, GERD.   Chest x-ray pending to rule out pulmonary cause.   CBC with diff pending.  Orders: -  DG Chest 2 View  Headache, unspecified headache type Assessment & Plan: Unclear etiology.   Could be secondary to PTSD, anxiety and insomnia.   Recommend tylenol during the day as needed.   Will do a trial of flexeril 5 mg at bedtime PRN. Follow up in four weeks.   If not improving, consider CT head and/or neurology referral.   Orders: -     Cyclobenzaprine HCl; Take 1 tablet (5 mg total) by mouth at bedtime as needed for muscle spasms (headache).  Dispense: 30 tablet; Refill: 0  Sickle cell trait (HCC) Assessment & Plan: Controlled.   No concerns today.   Need for influenza vaccination -     Flu vaccine trivalent PF, 6mos and older(Flulaval,Afluria,Fluarix,Fluzone)  Transfer requested for continuity of care Assessment & Plan: EMR reviewed briefly.     PTSD (post-traumatic stress disorder) Assessment & Plan: Controlled.   [facility]      Return in about 4 weeks (around 01/09/2024) for headche and cough.   [place]esto Charon, NP

## 2023-12-12 ENCOUNTER — Encounter: Payer: Self-pay | Admitting: General Practice

## 2023-12-12 ENCOUNTER — Ambulatory Visit
Admission: RE | Admit: 2023-12-12 | Discharge: 2023-12-12 | Disposition: A | Discharge status: Home / Self Care | Payer: BC Managed Care – PPO | Source: Ambulatory Visit | Attending: General Practice | Admitting: General Practice

## 2023-12-12 ENCOUNTER — Ambulatory Visit: Payer: BC Managed Care – PPO | Admitting: General Practice

## 2023-12-12 VITALS — BP 100/62 | HR 62 | Temp 97.5°F | Ht 67.5 in | Wt 168.4 lb

## 2023-12-12 DIAGNOSIS — R053 Chronic cough: Secondary | ICD-10-CM | POA: Diagnosis not present

## 2023-12-12 DIAGNOSIS — Z23 Encounter for immunization: Secondary | ICD-10-CM | POA: Insufficient documentation

## 2023-12-12 DIAGNOSIS — Z7689 Persons encountering health services in other specified circumstances: Secondary | ICD-10-CM | POA: Insufficient documentation

## 2023-12-12 DIAGNOSIS — R519 Headache, unspecified: Secondary | ICD-10-CM | POA: Insufficient documentation

## 2023-12-12 DIAGNOSIS — D573 Sickle-cell trait: Secondary | ICD-10-CM

## 2023-12-12 DIAGNOSIS — I10 Essential (primary) hypertension: Secondary | ICD-10-CM | POA: Diagnosis not present

## 2023-12-12 DIAGNOSIS — F4310 Post-traumatic stress disorder, unspecified: Secondary | ICD-10-CM

## 2023-12-12 LAB — CBC WITH DIFFERENTIAL/PLATELET
Basophils Absolute: 0 10*3/uL (ref 0.0–0.1)
Basophils Relative: 0.5 % (ref 0.0–3.0)
Eosinophils Absolute: 0.3 10*3/uL (ref 0.0–0.7)
Eosinophils Relative: 6.1 % — ABNORMAL HIGH (ref 0.0–5.0)
HCT: 41.7 % (ref 39.0–52.0)
Hemoglobin: 13.7 g/dL (ref 13.0–17.0)
Lymphocytes Relative: 38.8 % (ref 12.0–46.0)
Lymphs Abs: 1.6 10*3/uL (ref 0.7–4.0)
MCHC: 33 g/dL (ref 30.0–36.0)
MCV: 86.3 fL (ref 78.0–100.0)
Monocytes Absolute: 0.4 10*3/uL (ref 0.1–1.0)
Monocytes Relative: 10.8 % (ref 3.0–12.0)
Neutro Abs: 1.8 10*3/uL (ref 1.4–7.7)
Neutrophils Relative %: 43.8 % (ref 43.0–77.0)
Platelets: 256 10*3/uL (ref 150.0–400.0)
RBC: 4.83 Mil/uL (ref 4.22–5.81)
RDW: 14.1 % (ref 11.5–15.5)
WBC: 4.1 10*3/uL (ref 4.0–10.5)

## 2023-12-12 LAB — BASIC METABOLIC PANEL
BUN: 12 mg/dL (ref 6–23)
CO2: 30 meq/L (ref 19–32)
Calcium: 9.1 mg/dL (ref 8.4–10.5)
Chloride: 104 meq/L (ref 96–112)
Creatinine, Ser: 1.21 mg/dL (ref 0.40–1.50)
GFR: 73.92 mL/min (ref >60.00)
Glucose, Bld: 98 mg/dL (ref 70–99)
Potassium: 3.8 meq/L (ref 3.5–5.1)
Sodium: 140 meq/L (ref 135–145)

## 2023-12-12 MED ORDER — CYCLOBENZAPRINE HCL 5 MG PO TABS: 5.0000 mg | ORAL_TABLET | Freq: Every evening | ORAL | 0 refills | Status: AC | PRN

## 2023-12-12 NOTE — Assessment & Plan Note (Signed)
Unclear etiology.   Could be secondary to PTSD, anxiety and insomnia.   Recommend tylenol during the day as needed.   Will do a trial of flexeril 5 mg at bedtime PRN. Follow up in four weeks.   If not improving, consider CT head and/or neurology referral.

## 2023-12-12 NOTE — Assessment & Plan Note (Signed)
EMR reviewed briefly.

## 2023-12-12 NOTE — Addendum Note (Signed)
Addended by: Modesto Charon on: 12/12/2023 10:57 AM   Modules accepted: Level of Service

## 2023-12-12 NOTE — Assessment & Plan Note (Addendum)
BP at goal today.  Continue Amolodipine 10 mg tablet, hydrochlorothiazide 25 mg and lisinopril 10 mg.   BMP pending.

## 2023-12-12 NOTE — Assessment & Plan Note (Signed)
Unclear etiology.  Differentials include secondary to the use of ACE inhibitor, GERD.   Chest x-ray pending to rule out pulmonary cause.   CBC with diff pending.

## 2023-12-12 NOTE — Assessment & Plan Note (Signed)
Controlled. No concerns today. 

## 2023-12-12 NOTE — Assessment & Plan Note (Signed)
Controlled.   Followed [facility]

## 2023-12-12 NOTE — Patient Instructions (Signed)
Stop by the lab prior to leaving today. I will notify you of your results once received.  Complete xray(s) prior to leaving today. I will notify you of your results once received.  For the headaches, you can try tylenol over the counter during the day. Stay hydrated.  If headache does not get resolved, try cyclobenzaprine 5 mg one tablet at night. It can help with sleeping as well.   Follow up in 4 weeks.   It was a pleasure meeting you!

## 2024-01-09 ENCOUNTER — Ambulatory Visit: Payer: BC Managed Care – PPO | Admitting: General Practice

## 2024-01-09 VITALS — BP 126/74 | HR 61 | Temp 97.7°F | Ht 67.5 in | Wt 172.0 lb

## 2024-01-09 DIAGNOSIS — R519 Headache, unspecified: Secondary | ICD-10-CM | POA: Diagnosis not present

## 2024-01-09 DIAGNOSIS — F419 Anxiety disorder, unspecified: Secondary | ICD-10-CM | POA: Diagnosis not present

## 2024-01-09 DIAGNOSIS — R053 Chronic cough: Secondary | ICD-10-CM | POA: Diagnosis not present

## 2024-01-09 MED ORDER — HYDROXYZINE PAMOATE 25 MG PO CAPS: 25.0000 mg | ORAL_CAPSULE | Freq: Three times a day (TID) | ORAL | 0 refills | Status: DC | PRN

## 2024-01-09 NOTE — Assessment & Plan Note (Addendum)
    01/09/2024    8:32 AM 12/12/2023    8:27 AM  GAD 7 : Generalized Anxiety Score  Nervous, Anxious, on Edge 0 0  Control/stop worrying 1 2  Worry too much - different things 0 1  Trouble relaxing 3 3  Restless 0 2  Easily annoyed or irritable 0 2  Afraid - awful might happen 0 0  Total GAD 7 Score 4 10  Anxiety Difficulty Not difficult at all Somewhat difficult    Result improved slightly from previous visit.   Suspect his headaches could be secondary to stress/anxiety.   He follows with psychologist at the [facility].   Feel that he would benefit from hydroxyzine 25 mg every 8 hours as needed for anxiety. Discussed side effects of medication. Patient verbalizes understanding. Rx sent.  Follow up in four weeks.

## 2024-01-09 NOTE — Progress Notes (Addendum)
 Established Patient Office Visit  Subjective   Patient ID: Julian Peters, male    DOB: 06-11-1981  Age: 43 y.o. MRN: [id number]  Chief Complaint  Patient presents with  . Follow-up    Headaches he is still experiencing them he has one about 1-2 times a week. The medicine that was given to him prior makes him drowsy    HPI  Julian Peters is a 43 year old male with past medical history of HTN, PTSD presents today for a follow up on headaches.   Chronic headache: He was evaluated on 12/27 and was given a prescription of flexeril 5 mg at bedtime and was asked to follow up in four weeks. Today he reports that he is still experiencing headaches intermittently 1-2 times per week; slightly improved as he was experiencing them 2-3 times when previously evaluated. He reports that headaches are usually secondary to stress and/or anxiety. With the headaches, he denies any blurred vision, unilateral body weakness, chest pain, shortness of breath or slurred speech. He denies any vision issues and has not been evaluated by optometry.   Anxiety/PTSD: Diagnosed several years ago. [facility] He contributes his stress to his job. He denies any night terrors but he reports that there are certain things that brings up old memories which causes the stress/anxiety which then leads to a headache. Overall, his anxiety has improved since the last appointment.   HTN/cough: currently managed on Amlodipine 10 mg once daily, hydrochlorothiazide 25 mg once daily and lisinopril 10 mg once daily. He has been on the same medications given by the [facility] for several years. He does report a dry cough that he has had for several years with no respiratory symptoms. The cough is intermittent. It does not cause any shortness of breath or difficulty breathing. He had a chest x-ray completed on 12/12/23 which revealed no active cardiopulmonary disease.        01/09/2024    8:32 AM 12/12/2023     8:27 AM  GAD 7 : Generalized Anxiety Score  Nervous, Anxious, on Edge 0 0  Control/stop worrying 1 2  Worry too much - different things 0 1  Trouble relaxing 3 3  Restless 0 2  Easily annoyed or irritable 0 2  Afraid - awful might happen 0 0  Total GAD 7 Score 4 10  Anxiety Difficulty Not difficult at all Somewhat difficult       01/09/2024    8:32 AM 12/12/2023    8:25 AM 11/29/2020   12:17 PM  Depression screen PHQ 2/9  Decreased Interest 2 2 2   Down, Depressed, Hopeless 0 2 0  PHQ - 2 Score 2 4 2   Altered sleeping 3 3 2   Tired, decreased energy 2 2 1   Change in appetite 0 0 0  Feeling bad or failure about yourself  0 0 0  Trouble concentrating 0 2 1  Moving slowly or fidgety/restless 0 1 2  Suicidal thoughts 0 0 0  PHQ-9 Score 7 12 8   Difficult doing work/chores Not difficult at all Somewhat difficult Somewhat difficult     Patient Active Problem List   Diagnosis Date Noted  . Anxiety 01/09/2024  . Chronic cough 12/12/2023  . Headache, unspecified headache type 12/12/2023  . Achilles tendinitis of both lower extremities 08/08/2021  . Essential hypertension 11/29/2020  . PTSD (post-traumatic stress disorder) 11/29/2020  . Chronic low back pain 11/29/2020  . Sickle cell trait (HCC)  11/29/2020   Past Medical History:  Diagnosis Date  . Hypertension   . Migraine   . PTSD (post-traumatic stress disorder)    No Known Allergies       01/09/2024    8:32 AM 12/12/2023    8:25 AM 11/29/2020   12:17 PM  Depression screen PHQ 2/9  Decreased Interest 2 2 2   Down, Depressed, Hopeless 0 2 0  PHQ - 2 Score 2 4 2   Altered sleeping 3 3 2   Tired, decreased energy 2 2 1   Change in appetite 0 0 0  Feeling bad or failure about yourself  0 0 0  Trouble concentrating 0 2 1  Moving slowly or fidgety/restless 0 1 2  Suicidal thoughts 0 0 0  PHQ-9 Score 7 12 8   Difficult doing work/chores Not difficult at all Somewhat difficult Somewhat difficult       01/09/2024     8:32 AM 12/12/2023    8:27 AM  GAD 7 : Generalized Anxiety Score  Nervous, Anxious, on Edge 0 0  Control/stop worrying 1 2  Worry too much - different things 0 1  Trouble relaxing 3 3  Restless 0 2  Easily annoyed or irritable 0 2  Afraid - awful might happen 0 0  Total GAD 7 Score 4 10  Anxiety Difficulty Not difficult at all Somewhat difficult      Review of Systems  Constitutional:  Negative for chills and fever.  Respiratory:  Negative for shortness of breath.   Cardiovascular:  Negative for chest pain.  Gastrointestinal:  Negative for abdominal pain, constipation, diarrhea, heartburn, nausea and vomiting.  Genitourinary:  Negative for dysuria, frequency and urgency.  Neurological:  Negative for dizziness and headaches.  Endo/Heme/Allergies:  Negative for polydipsia.  Psychiatric/Behavioral:  Negative for depression and suicidal ideas. The patient is not nervous/anxious.       Objective:     BP 126/74   Pulse 61   Temp 97.7 F (36.5 C) (Oral)   Ht 5' 7.5" (1.715 m)   Wt 172 lb (78 kg)   SpO2 97%   BMI 26.54 kg/m  BP Readings from Last 3 Encounters:  01/09/24 126/74  12/12/23 100/62  08/08/21 110/80   Wt Readings from Last 3 Encounters:  01/09/24 172 lb (78 kg)  12/12/23 168 lb 6 oz (76.4 kg)  08/08/21 167 lb 4 oz (75.9 kg)      Physical Exam Vitals and nursing note reviewed.  Constitutional:      Appearance: Normal appearance.  Cardiovascular:     Rate and Rhythm: Normal rate and regular rhythm.     Pulses: Normal pulses.     Heart sounds: Normal heart sounds.  Pulmonary:     Effort: Pulmonary effort is normal.     Breath sounds: Normal breath sounds.  Neurological:     Mental Status: He is alert and oriented to person, place, and time.     Cranial Nerves: Cranial nerves 2-12 are intact.     Motor: No weakness.     Gait: Gait is intact.     Deep Tendon Reflexes:     Reflex Scores:      Patellar reflexes are 2+ on the right side and 2+ on the  left side. Psychiatric:        Mood and Affect: Mood normal.        Behavior: Behavior normal.        Thought Content: Thought content normal.  Judgment: Judgment normal.     No results found for any visits on 01/09/24.     The 10-year ASCVD risk score (Arnett DK, et al., 2019) is: 5.8%    Assessment & Plan:  Anxiety Assessment & Plan:    01/09/2024    8:32 AM 12/12/2023    8:27 AM  GAD 7 : Generalized Anxiety Score  Nervous, Anxious, on Edge 0 0  Control/stop worrying 1 2  Worry too much - different things 0 1  Trouble relaxing 3 3  Restless 0 2  Easily annoyed or irritable 0 2  Afraid - awful might happen 0 0  Total GAD 7 Score 4 10  Anxiety Difficulty Not difficult at all Somewhat difficult    Result improved slightly from previous visit.   Suspect his headaches could be secondary to stress/anxiety.   He follows with psychologist at the [facility].   Feel that he would benefit from hydroxyzine 25 mg every 8 hours as needed for anxiety. Discussed side effects of medication. Patient verbalizes understanding. Rx sent.  Follow up in four weeks.  Orders: -     hydrOXYzine Pamoate; Take 1 capsule (25 mg total) by mouth every 8 (eight) hours as needed.  Dispense: 30 capsule; Refill: 0  Headache, unspecified headache type Assessment & Plan: Improving overall. Neuro exam and physical exam stable. No red flags.   Continue cyclobenzaprine as needed.   Referral placed for neurology.   Orders: -     Ambulatory referral to Neurology  Chronic cough Assessment & Plan: He will discuss the cough with his doctor at the [facility] in regards to discontinuing lisinopril.   BP  at goal today.   Follow up 4 weeks.     Return in about 4 weeks (around 02/06/2024) for anxiety/headache .    Modesto Charon, NP

## 2024-01-09 NOTE — Patient Instructions (Signed)
[facility].   Start hydroxyzine 25 mg at bedtime. It can make you drowsy.   Flexeril can be cut in half if it is making you too drowsy.   You will either be contacted via phone regarding your referral to neurology, or you may receive a letter on your MyChart portal from our referral team with instructions for scheduling an appointment. Please let us know if you have not been contacted by anyone within two weeks.  Follow up in 4 weeks. Physical at the end of June.   It was a pleasure to see you today!

## 2024-01-10 NOTE — Assessment & Plan Note (Signed)
Improving overall. Neuro exam and physical exam stable. No red flags.   Continue cyclobenzaprine as needed.   Referral placed for neurology.

## 2024-01-10 NOTE — Assessment & Plan Note (Addendum)
He will discuss the cough with his doctor at the [facility] in regards to discontinuing lisinopril.   BP  at goal today.   Follow up 4 weeks.

## 2024-01-21 DIAGNOSIS — F411 Generalized anxiety disorder: Secondary | ICD-10-CM | POA: Diagnosis not present

## 2024-01-21 DIAGNOSIS — R519 Headache, unspecified: Secondary | ICD-10-CM | POA: Diagnosis not present

## 2024-01-21 DIAGNOSIS — F3341 Major depressive disorder, recurrent, in partial remission: Secondary | ICD-10-CM | POA: Diagnosis not present

## 2024-02-09 ENCOUNTER — Encounter: Payer: Self-pay | Admitting: General Practice

## 2024-02-09 ENCOUNTER — Ambulatory Visit: Payer: BC Managed Care – PPO | Admitting: General Practice

## 2024-02-09 VITALS — BP 120/76 | HR 73 | Temp 97.8°F | Ht 67.5 in | Wt 174.0 lb

## 2024-02-09 DIAGNOSIS — F419 Anxiety disorder, unspecified: Secondary | ICD-10-CM

## 2024-02-09 DIAGNOSIS — I10 Essential (primary) hypertension: Secondary | ICD-10-CM | POA: Diagnosis not present

## 2024-02-09 DIAGNOSIS — R519 Headache, unspecified: Secondary | ICD-10-CM | POA: Diagnosis not present

## 2024-02-09 DIAGNOSIS — R053 Chronic cough: Secondary | ICD-10-CM

## 2024-02-09 DIAGNOSIS — F4310 Post-traumatic stress disorder, unspecified: Secondary | ICD-10-CM

## 2024-02-09 NOTE — Assessment & Plan Note (Signed)
 Improving.   Hydroxyzine discontinued by neurologist.   He was started on Nortriptyline 20 mg once daily to help with headache, anxiety and sleep.

## 2024-02-09 NOTE — Assessment & Plan Note (Signed)
 Controlled.   Discussed to schedule a follow up with psychologist at [facility] for financial reasons.

## 2024-02-09 NOTE — Assessment & Plan Note (Signed)
 At goal.   He was started on Losartan by his doctor at [facility]. due to the chronic cough secondary to Ace Inhibitor.  Continue Losartan 25 mg once daily and Amlodipine 10 mg once daily.   Follow up in June.

## 2024-02-09 NOTE — Progress Notes (Signed)
 Established Patient Office Visit  Subjective   Patient ID: Julian Peters, male    DOB: 31-Mar-1981  Age: 43 y.o. MRN: [id number]  Chief Complaint  Patient presents with   Anxiety    Patient thinks anxiety is a little better.    Headache    Much better    Anxiety Patient reports no chest pain, dizziness, nausea, nervous/anxious behavior, shortness of breath or suicidal ideas.    Headache  Pertinent negatives include no abdominal pain, dizziness, fever, nausea or vomiting.    Tyrelle Raczka is a 67 male with past medical history of HTN, PTSD, anxiety, chronic low back pain presents today for a follow up for anxiety.   Anxiety/PTSD/Headache: He was evaluated on 01/09/24, when he was started on hydroxyzine 25 mg every 8 hours as needed. 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 Today he reports, that he was evaluated at the neurologist on 01/21/24 and he was asked to switched hydroxyzine to Nortriptyline 20 mg once daily to help with headache and anxiety. Headaches have decreased to once a day.   Cough: He was evaluated by the [facility] on 01/16/24 regarding the ace inhibitor could be contributing to the cough. He was switched to Losartan 25 mg once daily. His cough has stopped. His blood pressure readings at home have been in the 120s/mid 80s range. He denies any blurred vision, headache is once a week, chest pain, shortness of breath or difficulty breathing.   Patient Active Problem List   Diagnosis Date Noted   Anxiety 01/09/2024   Chronic cough 12/12/2023   Headache, unspecified headache type 12/12/2023   Achilles tendinitis of both lower extremities 08/08/2021   Essential hypertension 11/29/2020   PTSD (post-traumatic stress disorder) 11/29/2020   Chronic low back pain 11/29/2020   Sickle cell trait (HCC) 11/29/2020   Past Medical History:  Diagnosis Date   Hypertension    Migraine    PTSD (post-traumatic stress disorder)    History reviewed. No pertinent surgical  history. Allergies  Allergen Reactions   Lisinopril Cough         02/09/2024    8:22 AM 01/09/2024    8:32 AM 12/12/2023    8:25 AM  Depression screen PHQ 2/9  Decreased Interest 1 2 2   Down, Depressed, Hopeless 0 0 2  PHQ - 2 Score 1 2 4   Altered sleeping 2 3 3   Tired, decreased energy 2 2 2   Change in appetite 0 0 0  Feeling bad or failure about yourself  0 0 0  Trouble concentrating 1 0 2  Moving slowly or fidgety/restless 0 0 1  Suicidal thoughts 0 0 0  PHQ-9 Score 6 7 12   Difficult doing work/chores Somewhat difficult Not difficult at all Somewhat difficult       02/09/2024    8:22 AM 01/09/2024    8:32 AM 12/12/2023    8:27 AM  GAD 7 : Generalized Anxiety Score  Nervous, Anxious, on Edge 0 0 0  Control/stop worrying 1 1 2   Worry too much - different things 1 0 1  Trouble relaxing 2 3 3   Restless 1 0 2  Easily annoyed or irritable 1 0 2  Afraid - awful might happen 0 0 0  Total GAD 7 Score 6 4 10   Anxiety Difficulty Somewhat difficult Not difficult at all Somewhat difficult      Review of Systems  Constitutional:  Negative for chills and fever.  Respiratory:  Negative for shortness of breath.  Cardiovascular:  Negative for chest pain.  Gastrointestinal:  Negative for abdominal pain, constipation, diarrhea, heartburn, nausea and vomiting.  Genitourinary:  Negative for dysuria, frequency and urgency.  Neurological:  Positive for headaches. Negative for dizziness.  Endo/Heme/Allergies:  Negative for polydipsia.  Psychiatric/Behavioral:  Negative for depression and suicidal ideas. The patient is not nervous/anxious.       Objective:     BP 120/76 (BP Location: Left Arm, Patient Position: Sitting, Cuff Size: Normal)   Pulse 73   Temp 97.8 F (36.6 C) (Oral)   Ht 5' 7.5" (1.715 m)   Wt 174 lb (78.9 kg)   SpO2 97%   BMI 26.85 kg/m  BP Readings from Last 3 Encounters:  02/09/24 120/76  01/09/24 126/74  12/12/23 100/62   Wt Readings from Last 3  Encounters:  02/09/24 174 lb (78.9 kg)  01/09/24 172 lb (78 kg)  12/12/23 168 lb 6 oz (76.4 kg)      Physical Exam Vitals and nursing note reviewed.  Constitutional:      Appearance: Normal appearance.  Cardiovascular:     Rate and Rhythm: Normal rate and regular rhythm.     Pulses: Normal pulses.     Heart sounds: Normal heart sounds.  Pulmonary:     Effort: Pulmonary effort is normal.     Breath sounds: Normal breath sounds.  Neurological:     Mental Status: He is alert and oriented to person, place, and time.  Psychiatric:        Mood and Affect: Mood normal.        Behavior: Behavior normal.        Thought Content: Thought content normal.        Judgment: Judgment normal.      No results found for any visits on 02/09/24.     The 10-year ASCVD risk score (Arnett DK, et al., 2019) is: 5.3%    Assessment & Plan:  Chronic cough Assessment & Plan: Improved.   Ace inhibitor was discontinued and his symptoms have improved.    Essential hypertension Assessment & Plan: At goal.   He was started on Losartan by his doctor at [facility]. due to the chronic cough secondary to Ace Inhibitor.  Continue Losartan 25 mg once daily and Amlodipine 10 mg once daily.   Follow up in June.    Headache, unspecified headache type Assessment & Plan: Improving.   Followed by neurology.   Reviewed notes from 01/16/24.   Continue Nortriptyline 20 mg once daily. Discussed side effects.    Anxiety Assessment & Plan: Improving.   Hydroxyzine discontinued by neurologist.   He was started on Nortriptyline 20 mg once daily to help with headache, anxiety and sleep.   PTSD (post-traumatic stress disorder) Assessment & Plan: Controlled.   Discussed to schedule a follow up with psychologist at [facility] for financial reasons.     Return in about 17 weeks (around 06/07/2024) for physical..    Modesto Charon, NP

## 2024-02-09 NOTE — Assessment & Plan Note (Signed)
 Improved.   Ace inhibitor was discontinued and his symptoms have improved.

## 2024-02-09 NOTE — Patient Instructions (Addendum)
 Continue medications as prescribed.   Schedule follow up with psychologist to discuss PTSD.  Follow up in June for physical.

## 2024-02-09 NOTE — Assessment & Plan Note (Signed)
 Improving.   Followed by neurology.   Reviewed notes from 01/16/24.   Continue Nortriptyline 20 mg once daily. Discussed side effects.

## 2024-03-15 DIAGNOSIS — F411 Generalized anxiety disorder: Secondary | ICD-10-CM | POA: Diagnosis not present

## 2024-03-15 DIAGNOSIS — F3341 Major depressive disorder, recurrent, in partial remission: Secondary | ICD-10-CM | POA: Diagnosis not present

## 2024-03-15 DIAGNOSIS — R519 Headache, unspecified: Secondary | ICD-10-CM | POA: Diagnosis not present

## 2024-06-07 ENCOUNTER — Ambulatory Visit (INDEPENDENT_AMBULATORY_CARE_PROVIDER_SITE_OTHER): Payer: BC Managed Care – PPO | Admitting: General Practice

## 2024-06-07 ENCOUNTER — Ambulatory Visit: Payer: Self-pay | Admitting: General Practice

## 2024-06-07 VITALS — BP 132/80 | HR 61 | Temp 98.3°F | Ht 67.5 in | Wt 169.0 lb

## 2024-06-07 DIAGNOSIS — I10 Essential (primary) hypertension: Secondary | ICD-10-CM

## 2024-06-07 DIAGNOSIS — R519 Headache, unspecified: Secondary | ICD-10-CM

## 2024-06-07 DIAGNOSIS — Z131 Encounter for screening for diabetes mellitus: Secondary | ICD-10-CM

## 2024-06-07 DIAGNOSIS — F419 Anxiety disorder, unspecified: Secondary | ICD-10-CM | POA: Diagnosis not present

## 2024-06-07 DIAGNOSIS — F4310 Post-traumatic stress disorder, unspecified: Secondary | ICD-10-CM

## 2024-06-07 DIAGNOSIS — Z0000 Encounter for general adult medical examination without abnormal findings: Secondary | ICD-10-CM

## 2024-06-07 LAB — CBC
HCT: 43.1 % (ref 39.0–52.0)
Hemoglobin: 14.4 g/dL (ref 13.0–17.0)
MCHC: 33.4 g/dL (ref 30.0–36.0)
MCV: 83.9 fl (ref 78.0–100.0)
Platelets: 258 10*3/uL (ref 150.0–400.0)
RBC: 5.14 Mil/uL (ref 4.22–5.81)
RDW: 13.7 % (ref 11.5–15.5)
WBC: 4.3 10*3/uL (ref 4.0–10.5)

## 2024-06-07 LAB — COMPREHENSIVE METABOLIC PANEL WITH GFR
ALT: 20 U/L (ref 0–53)
AST: 42 U/L — ABNORMAL HIGH (ref 0–37)
Albumin: 4.5 g/dL (ref 3.5–5.2)
Alkaline Phosphatase: 52 U/L (ref 39–117)
BUN: 14 mg/dL (ref 6–23)
CO2: 31 meq/L (ref 19–32)
Calcium: 9.6 mg/dL (ref 8.4–10.5)
Chloride: 99 meq/L (ref 96–112)
Creatinine, Ser: 1.37 mg/dL (ref 0.40–1.50)
GFR: 63.47 mL/min (ref >60.00)
Glucose, Bld: 97 mg/dL (ref 70–99)
Potassium: 3.7 meq/L (ref 3.5–5.1)
Sodium: 140 meq/L (ref 135–145)
Total Bilirubin: 1.3 mg/dL — ABNORMAL HIGH (ref 0.2–1.2)
Total Protein: 7.4 g/dL (ref 6.0–8.3)

## 2024-06-07 LAB — HEMOGLOBIN A1C: Hgb A1c MFr Bld: 5.9 % (ref 4.6–6.5)

## 2024-06-07 LAB — LIPID PANEL
Cholesterol: 225 mg/dL — ABNORMAL HIGH (ref 0–200)
HDL: 53.8 mg/dL (ref >39.00)
LDL Cholesterol: 158 mg/dL — ABNORMAL HIGH (ref 0–99)
NonHDL: 171.62
Total CHOL/HDL Ratio: 4
Triglycerides: 66 mg/dL (ref 0.0–149.0)
VLDL: 13.2 mg/dL (ref 0.0–40.0)

## 2024-06-07 LAB — TSH: TSH: 1.37 u[IU]/mL (ref 0.35–5.50)

## 2024-06-07 NOTE — Patient Instructions (Addendum)
 Stop by the lab prior to leaving today. I will notify you of your results once received.   Continue to monitor diet and decrease salt intake.   Continue medications as prescribed for BP and headaches.   Follow up in 6 months.   It was a pleasure to see you today!

## 2024-06-07 NOTE — Assessment & Plan Note (Signed)
 Controlled.   Continue Losartan 25 mg once daily, hydrochlorothiazide 25 mg once daily and Amlodipine 10 mg once daily.  He gets refills from the [facility]  Follow up in 6 months.  Labs pending.

## 2024-06-07 NOTE — Progress Notes (Signed)
 Established Patient Office Visit  Subjective   Patient ID: Julian Peters, male    DOB: 1981-03-21  Age: 43 y.o. MRN: [id number]  Chief Complaint  Patient presents with   Annual Exam    HPI  Julian Peters is a 43 year old male with past medical history of PTSD, HTN, chronic low back pain, anxiety presents today today for complete physical and follow up of chronic conditions.  Immunizations: -Tetanus: Completed in 2025 -Influenza: completed  Diet: Fair diet.  Exercise:  regular exercise.  Eye exam: Completed several years ago. Dental exam: Completes semi-annually    Headaches: following with neurologist. Currently managed on Nortriptyline 10 mg in the morning and/or 10 mg at night for headaches on days that patient has to wake up early for work. On days patient does not have to wake up early, take Nortriptyline 10 mgin the morning and 20-30 mg at night only. He is scheduled to follow up with neurology in four months. He was also referred to psychologist at [facility] in [facility] for PTSD. Today he reports that he has not tried the increased dose due to his schedule at work. He did follow up with the psychologist at the [facility] who advised him that he just having sleep trouble and not PTSD. He does plan to call back and follow up with them.   HTN: following with the [facility] Currently managed on Losartan 25 mg once daily, hydrochlorothiazide 25 mg and Amlodipine 10 mg once daily. The home BP readings have been in the 130's / 80's range.    Patient Active Problem List   Diagnosis Date Noted   Encounter for screening and preventative care 06/07/2024   Anxiety 01/09/2024   Chronic cough 12/12/2023   Headache, unspecified headache type 12/12/2023   Achilles tendinitis of both lower extremities 08/08/2021   Essential hypertension 11/29/2020   PTSD (post-traumatic stress disorder) 11/29/2020   Chronic low back pain 11/29/2020   Sickle cell trait (HCC) 11/29/2020   Past Medical History:  Diagnosis  Date   Hypertension    Migraine    PTSD (post-traumatic stress disorder)    History reviewed. No pertinent surgical history. Allergies  Allergen Reactions   Lisinopril  Cough         06/07/2024    8:10 AM 02/09/2024    8:22 AM 01/09/2024    8:32 AM  Depression screen PHQ 2/9  Decreased Interest 2 1 2   Down, Depressed, Hopeless 2 0 0  PHQ - 2 Score 4 1 2   Altered sleeping 3 2 3   Tired, decreased energy 1 2 2   Change in appetite 2 0 0  Feeling bad or failure about yourself  0 0 0  Trouble concentrating 2 1 0  Moving slowly or fidgety/restless 2 0 0  Suicidal thoughts 0 0 0  PHQ-9 Score 14 6 7   Difficult doing work/chores Very difficult Somewhat difficult Not difficult at all       06/07/2024    8:11 AM 02/09/2024    8:22 AM 01/09/2024    8:32 AM 12/12/2023    8:27 AM  GAD 7 : Generalized Anxiety Score  Nervous, Anxious, on Edge 2 0 0 0  Control/stop worrying 2 1 1 2   Worry too much - different things 2 1 0 1  Trouble relaxing 3 2 3 3   Restless 2 1 0 2  Easily annoyed or irritable 2 1 0 2  Afraid - awful might happen 1 0 0 0  Total GAD 7 Score  14 6 4 10   Anxiety Difficulty Somewhat difficult Somewhat difficult Not difficult at all Somewhat difficult      Review of Systems  Constitutional:  Negative for chills, fever, malaise/fatigue and weight loss.  HENT:  Negative for congestion, ear discharge, ear pain, hearing loss, nosebleeds, sinus pain, sore throat and tinnitus.   Eyes:  Negative for blurred vision, double vision, pain, discharge and redness.  Respiratory:  Negative for cough, shortness of breath, wheezing and stridor.   Cardiovascular:  Negative for chest pain, palpitations and leg swelling.  Gastrointestinal:  Negative for abdominal pain, constipation, diarrhea, heartburn, nausea and vomiting.  Genitourinary:  Negative for dysuria, frequency and urgency.  Musculoskeletal:  Negative for myalgias.  Skin:  Negative for rash.  Neurological:  Negative for  dizziness, tingling, seizures, weakness and headaches.  Psychiatric/Behavioral:  Negative for depression, substance abuse and suicidal ideas. The patient is not nervous/anxious.       Objective:     BP 132/80   Pulse 61   Temp 98.3 F (36.8 C) (Oral)   Ht 5' 7.5 (1.715 m)   Wt 169 lb (76.7 kg)   SpO2 97%   BMI 26.08 kg/m  BP Readings from Last 3 Encounters:  06/07/24 132/80  02/09/24 120/76  01/09/24 126/74   Wt Readings from Last 3 Encounters:  06/07/24 169 lb (76.7 kg)  02/09/24 174 lb (78.9 kg)  01/09/24 172 lb (78 kg)      Physical Exam Vitals and nursing note reviewed.  Constitutional:      Appearance: Normal appearance.  HENT:     Head: Normocephalic and atraumatic.     Right Ear: Tympanic membrane, ear canal and external ear normal.     Left Ear: Tympanic membrane, ear canal and external ear normal.     Nose: Nose normal.     Mouth/Throat:     Mouth: Mucous membranes are moist.     Pharynx: Oropharynx is clear.   Eyes:     Conjunctiva/sclera: Conjunctivae normal.     Pupils: Pupils are equal, round, and reactive to light.    Cardiovascular:     Rate and Rhythm: Normal rate and regular rhythm.     Pulses: Normal pulses.     Heart sounds: Normal heart sounds.  Pulmonary:     Effort: Pulmonary effort is normal.     Breath sounds: Normal breath sounds.  Abdominal:     General: Abdomen is flat. Bowel sounds are normal.     Palpations: Abdomen is soft.   Musculoskeletal:        General: Normal range of motion.     Cervical back: Normal range of motion.   Skin:    General: Skin is warm and dry.     Capillary Refill: Capillary refill takes less than 2 seconds.   Neurological:     General: No focal deficit present.     Mental Status: He is alert and oriented to person, place, and time. Mental status is at baseline.   Psychiatric:        Mood and Affect: Mood normal.        Behavior: Behavior normal.        Thought Content: Thought content  normal.        Judgment: Judgment normal.      No results found for any visits on 06/07/24.     The 10-year ASCVD risk score (Arnett DK, et al., 2019) is: 6.3%    Assessment & Plan:  Encounter for  screening and preventative care Assessment & Plan: Immunizations UTD.  Discussed the importance of a healthy diet and regular exercise in order for weight loss, and to reduce the risk of further co-morbidity.  Exam stable. Labs pending.  Follow up in 1 year for repeat physical.   Orders: -     Lipid panel -     Comprehensive metabolic panel with GFR -     TSH -     CBC -     Hemoglobin A1c  Essential hypertension Assessment & Plan: Controlled.   Continue Losartan 25 mg once daily, hydrochlorothiazide 25 mg once daily and Amlodipine 10 mg once daily.  He gets refills from the [facility]  Follow up in 6 months.  Labs pending.  Orders: -     Lipid panel -     Comprehensive metabolic panel with GFR -     TSH -     CBC -     Hemoglobin A1c  Anxiety Assessment & Plan: Uncontrolled.  Following with psychologist at the [facility]. Has not been taking his medication as prescribed due to stress at work.  Discussed the importance of medication adherence.   Continue Nortriptyline as per neurology recommendation.    Headache, unspecified headache type Assessment & Plan: Improving however he has not been consistent with his medications as per neurology recommendation.  Reviewed neurology notes from March, 2025. Discussed medication adherence at length.   Continue Nortriptyline as per neurology recommendation.    PTSD (post-traumatic stress disorder) Assessment & Plan: Controlled.  Following with psychology at [facility]      Return in about 6 months (around 12/07/2024) for HTN and chronic care management.SABRA Carrol Aurora, NP

## 2024-06-07 NOTE — Assessment & Plan Note (Signed)
 Controlled.  Following with psychology at [facility]

## 2024-06-07 NOTE — Assessment & Plan Note (Signed)
 Improving however he has not been consistent with his medications as per neurology recommendation.  Reviewed neurology notes from March, 2025. Discussed medication adherence at length.   Continue Nortriptyline as per neurology recommendation.

## 2024-06-07 NOTE — Assessment & Plan Note (Signed)
Immunizations UTD.  Discussed the importance of a healthy diet and regular exercise in order for weight loss, and to reduce the risk of further co-morbidity.  Exam stable. Labs pending.  Follow up in 1 year for repeat physical.  

## 2024-06-07 NOTE — Assessment & Plan Note (Signed)
 Uncontrolled.  Following with psychologist at the [facility]. Has not been taking his medication as prescribed due to stress at work.  Discussed the importance of medication adherence.   Continue Nortriptyline as per neurology recommendation.

## 2024-07-15 DIAGNOSIS — F3341 Major depressive disorder, recurrent, in partial remission: Secondary | ICD-10-CM | POA: Diagnosis not present

## 2024-07-15 DIAGNOSIS — F411 Generalized anxiety disorder: Secondary | ICD-10-CM | POA: Diagnosis not present

## 2024-07-15 DIAGNOSIS — G479 Sleep disorder, unspecified: Secondary | ICD-10-CM | POA: Diagnosis not present

## 2024-07-15 DIAGNOSIS — G43809 Other migraine, not intractable, without status migrainosus: Secondary | ICD-10-CM | POA: Diagnosis not present
